# Patient Record
Sex: Female | Born: 1955 | Race: White | Hispanic: No | Marital: Married | Smoking: Never smoker
Health system: Southern US, Community
[De-identification: ages and names within clinical notes are randomized; demographics above are authoritative.]

## PROBLEM LIST (undated history)

## (undated) DIAGNOSIS — M1990 Unspecified osteoarthritis, unspecified site: Secondary | ICD-10-CM

## (undated) DIAGNOSIS — I2510 Atherosclerotic heart disease of native coronary artery without angina pectoris: Secondary | ICD-10-CM

## (undated) DIAGNOSIS — K219 Gastro-esophageal reflux disease without esophagitis: Secondary | ICD-10-CM

## (undated) DIAGNOSIS — I10 Essential (primary) hypertension: Secondary | ICD-10-CM

## (undated) DIAGNOSIS — J45909 Unspecified asthma, uncomplicated: Secondary | ICD-10-CM

## (undated) HISTORY — PX: TONSILLECTOMY: SUR1361

## (undated) HISTORY — DX: Unspecified osteoarthritis, unspecified site: M19.90

## (undated) HISTORY — PX: BACK SURGERY: SHX140

## (undated) HISTORY — DX: Gastro-esophageal reflux disease without esophagitis: K21.9

## (undated) HISTORY — DX: Unspecified asthma, uncomplicated: J45.909

## (undated) HISTORY — DX: Atherosclerotic heart disease of native coronary artery without angina pectoris: I25.10

---

## 2001-06-29 HISTORY — PX: LUMBAR DISC SURGERY: SHX700

## 2004-09-16 ENCOUNTER — Ambulatory Visit: Payer: Self-pay | Admitting: Family Medicine

## 2004-10-06 ENCOUNTER — Ambulatory Visit: Payer: Self-pay | Admitting: Family Medicine

## 2005-10-12 ENCOUNTER — Ambulatory Visit: Payer: Self-pay | Admitting: Family Medicine

## 2006-10-21 ENCOUNTER — Ambulatory Visit: Payer: Self-pay | Admitting: Family Medicine

## 2006-11-15 ENCOUNTER — Ambulatory Visit: Payer: Self-pay | Admitting: Internal Medicine

## 2006-11-28 ENCOUNTER — Ambulatory Visit: Payer: Self-pay | Admitting: Internal Medicine

## 2006-12-28 ENCOUNTER — Ambulatory Visit: Payer: Self-pay | Admitting: Internal Medicine

## 2007-01-28 ENCOUNTER — Ambulatory Visit: Payer: Self-pay | Admitting: Internal Medicine

## 2007-02-28 ENCOUNTER — Ambulatory Visit: Payer: Self-pay | Admitting: Internal Medicine

## 2007-04-30 ENCOUNTER — Ambulatory Visit: Payer: Self-pay | Admitting: Internal Medicine

## 2007-05-09 ENCOUNTER — Ambulatory Visit: Payer: Self-pay | Admitting: Internal Medicine

## 2007-05-30 ENCOUNTER — Ambulatory Visit: Payer: Self-pay | Admitting: Internal Medicine

## 2007-10-25 ENCOUNTER — Ambulatory Visit: Payer: Self-pay | Admitting: Family Medicine

## 2007-10-28 ENCOUNTER — Ambulatory Visit: Payer: Self-pay | Admitting: Internal Medicine

## 2008-06-29 HISTORY — PX: COLONOSCOPY: SHX174

## 2008-07-09 ENCOUNTER — Ambulatory Visit: Payer: Self-pay | Admitting: Gastroenterology

## 2008-07-09 LAB — HM COLONOSCOPY

## 2011-04-26 ENCOUNTER — Emergency Department: Payer: Self-pay | Admitting: Unknown Physician Specialty

## 2013-06-29 HISTORY — PX: CARDIAC CATHETERIZATION: SHX172

## 2013-11-28 ENCOUNTER — Encounter: Payer: Self-pay | Admitting: Internal Medicine

## 2015-03-05 ENCOUNTER — Ambulatory Visit: Payer: BLUE CROSS/BLUE SHIELD | Attending: Otolaryngology

## 2015-03-05 DIAGNOSIS — G4733 Obstructive sleep apnea (adult) (pediatric): Secondary | ICD-10-CM | POA: Insufficient documentation

## 2016-09-24 ENCOUNTER — Emergency Department
Admission: EM | Admit: 2016-09-24 | Discharge: 2016-09-24 | Disposition: A | Discharge status: Home / Self Care | Payer: BLUE CROSS/BLUE SHIELD | Attending: Emergency Medicine | Admitting: Emergency Medicine

## 2016-09-24 ENCOUNTER — Emergency Department: Payer: BLUE CROSS/BLUE SHIELD

## 2016-09-24 ENCOUNTER — Encounter: Payer: Self-pay | Admitting: Emergency Medicine

## 2016-09-24 DIAGNOSIS — K529 Noninfective gastroenteritis and colitis, unspecified: Secondary | ICD-10-CM | POA: Insufficient documentation

## 2016-09-24 DIAGNOSIS — R1032 Left lower quadrant pain: Secondary | ICD-10-CM

## 2016-09-24 DIAGNOSIS — R197 Diarrhea, unspecified: Secondary | ICD-10-CM

## 2016-09-24 DIAGNOSIS — I10 Essential (primary) hypertension: Secondary | ICD-10-CM | POA: Diagnosis not present

## 2016-09-24 DIAGNOSIS — Z79899 Other long term (current) drug therapy: Secondary | ICD-10-CM | POA: Insufficient documentation

## 2016-09-24 HISTORY — DX: Essential (primary) hypertension: I10

## 2016-09-24 LAB — URINALYSIS, COMPLETE (UACMP) WITH MICROSCOPIC
BILIRUBIN URINE: NEGATIVE
Glucose, UA: NEGATIVE mg/dL
Ketones, ur: NEGATIVE mg/dL
LEUKOCYTES UA: NEGATIVE
NITRITE: NEGATIVE
Protein, ur: NEGATIVE mg/dL
SPECIFIC GRAVITY, URINE: 1.003 — AB (ref 1.005–1.030)
pH: 6 (ref 5.0–8.0)

## 2016-09-24 LAB — BASIC METABOLIC PANEL
BUN: 10 (ref 4–21)
Creatinine: 0.9 (ref 0.5–1.1)
Glucose: 133
Potassium: 3.9 (ref 3.4–5.3)
Sodium: 139 (ref 137–147)

## 2016-09-24 LAB — GASTROINTESTINAL PANEL BY PCR, STOOL (REPLACES STOOL CULTURE)

## 2016-09-24 LAB — C DIFFICILE QUICK SCREEN W PCR REFLEX
C DIFFICILE (CDIFF) INTERP: DETECTED
C Diff antigen: POSITIVE — AB
C Diff toxin: POSITIVE — AB

## 2016-09-24 MED ORDER — IOPAMIDOL (ISOVUE-300) INJECTION 61%
100.0000 mL | Freq: Once | INTRAVENOUS | Status: AC | PRN
  Administered 2016-09-24: 100 mL via INTRAVENOUS

## 2016-09-24 MED ORDER — ONDANSETRON 4 MG PO TBDP: 4.0000 mg | ORAL_TABLET | Freq: Four times a day (QID) | ORAL | 0 refills | Status: DC | PRN

## 2016-09-24 MED ORDER — HYDROMORPHONE HCL 1 MG/ML IJ SOLN
0.5000 mg | Freq: Once | INTRAMUSCULAR | Status: AC
  Administered 2016-09-24: 0.5 mg via INTRAVENOUS
  Filled 2016-09-24: qty 1

## 2016-09-24 MED ORDER — IOPAMIDOL (ISOVUE-300) INJECTION 61%
30.0000 mL | Freq: Once | INTRAVENOUS | Status: AC | PRN
  Administered 2016-09-24: 30 mL via ORAL

## 2016-09-24 MED ORDER — SODIUM CHLORIDE 0.9 % IV BOLUS (SEPSIS)
1000.0000 mL | Freq: Once | INTRAVENOUS | Status: AC
  Administered 2016-09-24: 1000 mL via INTRAVENOUS

## 2016-09-24 MED ORDER — METRONIDAZOLE 500 MG PO TABS: 500.0000 mg | ORAL_TABLET | Freq: Three times a day (TID) | ORAL | 0 refills | Status: DC

## 2016-09-24 MED ORDER — ONDANSETRON HCL 4 MG/2ML IJ SOLN
4.0000 mg | Freq: Once | INTRAMUSCULAR | Status: AC
  Administered 2016-09-24: 4 mg via INTRAVENOUS
  Filled 2016-09-24: qty 2

## 2016-09-24 NOTE — ED Notes (Signed)
Patient transported to CT 

## 2016-09-24 NOTE — ED Provider Notes (Addendum)
----------------------------------------- 4:24 PM on 09/24/2016 -----------------------------------------  Ct Abdomen Pelvis W Contrast  Result Date: 09/24/2016 CLINICAL DATA:  61 year old female with history of worsening left lower quadrant abdominal pain for 1 week, particularly after eating. EXAM: CT ABDOMEN AND PELVIS WITH CONTRAST TECHNIQUE: Multidetector CT imaging of the abdomen and pelvis was performed using the standard protocol following bolus administration of intravenous contrast. CONTRAST:  100mL ISOVUE-300 IOPAMIDOL (ISOVUE-300) INJECTION 61% COMPARISON:  No priors. FINDINGS: Lower chest: Scarring in the inferior segment of the lingula. Calcifications of the aortic valve. Hepatobiliary: No cystic or solid hepatic lesions. No intra or extrahepatic biliary ductal dilatation. Gallbladder is normal in appearance. Pancreas: No pancreatic mass. No pancreatic ductal dilatation. No pancreatic or peripancreatic fluid or inflammatory changes. Spleen: Unremarkable. Adrenals/Urinary Tract: Bilateral kidneys and bilateral adrenal glands are normal in appearance. No hydroureteronephrosis. Urinary bladder is normal in appearance. Stomach/Bowel: Normal appearance of the stomach. No pathologic dilatation of small bowel or colon. Relatively diffuse colonic wall thickening which is most severe in the cecum and ascending colon where there is also extensive surrounding inflammatory changes in the adjacent fat, concerning for an acute colitis. The appendix is not confidently identified and may be surgically absent. Vascular/Lymphatic: Aortic atherosclerosis, without evidence of aneurysm or dissection in the abdominal or pelvic vasculature. No lymphadenopathy noted in the abdomen or pelvis. Reproductive: Uterus and ovaries are unremarkable in appearance. Other: No significant volume of ascites.  No pneumoperitoneum. Musculoskeletal: There are no aggressive appearing lytic or blastic lesions noted in the visualized  portions of the skeleton. IMPRESSION: 1. Findings are most compatible with a colitis, which appears most severe in the cecum and ascending colon. 2. Colonic diverticulosis. 3. Aortic atherosclerosis. 4. There are calcifications of the aortic valve. Echocardiographic correlation for evaluation of potential valvular dysfunction may be warranted if clinically indicated. Electronically Signed   By: Trudie Reedaniel  Entrikin M.D.   On: 09/24/2016 15:58     Patient is awake, alert and very pleasant. She reports her pain and nausea has resolved. She has not been vomiting. She feels and wishes to eat and drink, and is asking if she can go home soon. She does report feeling much improved. I carefully discussed her CT findings, and she has not yet been able to provide a stool sample. There is concern about possible C. difficile given recent antibiotic use, though also her CT does show evidence of colitis and noted diverticulosis raising suspicion for diverticulitis as well. She is now having formed stool, and has not had any more large volume diarrhea in the ER. On reexam her abdomen is soft nontender nondistended. Her white blood cell count is notably elevated outpatient, but she does not meet any other criteria that suggest sepsis. She appears appropriate for outpatient management, and I discussed her case findings and recent antibiotic use with Dr. Tobi BastosAnna who recommends discharge on flagyl only and await stool culture results. Advises no other anitbiotics as they could worsen C.Diff (if this is cause.) Advises close follow-up, and patient reports this would not be a problem that she follows with a clinic where she can get close follow-up in [place].  Return precautions and treatment recommendations and follow-up discussed with the patient who is agreeable with the plan.    Sharyn CreamerMark Ashely Joshua, MD 09/24/16 1629   ----------------------------------------- 6:08 PM on 09/24/2016 -----------------------------------------  C.  difficile positive. Called and spoke directly to patient about this, she will be setting up close follow-up, continuing Flagyl, and I again reaffirmed careful return precautions for which  she is very agreeable   Sharyn Creamer, MD 09/24/16 [id number]

## 2016-09-24 NOTE — ED Notes (Signed)
Date and time results received: 09/24/16 1803 (use smartphrase ".now" to insert current time)  Test:C-diff Critical Value: positive  Name of Provider Notified: Dr Wynelle BeckmannPadowski Orders Received? Or Actions Taken?: none, pt sent home on Flagyl

## 2016-09-24 NOTE — ED Provider Notes (Signed)
[facility] Emergency Department Provider Note  ____________________________________________  Time seen: Approximately 2:01 PM  I have reviewed the triage vital signs and the nursing notes.   HISTORY  Chief Complaint Abdominal Pain    HPI Stephanie Mullen is a 61 y.o. female with a history of recent antibiotic use presenting with left lower quadrant pain and diarrhea.The patient reports that several weeks ago, she was placed on Augmentin for her cough and cold symptoms. She immediately developed diarrhea and self discontinued that medication. Since then, she has had watery nonbloody diarrhea, up to 10 daily episodes area did on Sunday, she began to develop progressively worsening left lower quadrant pain that required her to skip work several days this week. She is not had any fever, chills pages had nausea without vomiting. No urinary symptoms. The patient presented to her primary care's edition's office today and was found to have a white blood cell count of 20.3 and examination concerning for left lower quadrant pain so was sent here for further evaluation and treatment.   Past Medical History:  Diagnosis Date  . Hypertension     There are no active problems to display for this patient.   Past Surgical History:  Procedure Laterality Date  . BACK SURGERY    . TONSILLECTOMY        Allergies Celebrex [celecoxib]  No family history on file.  Social History Social History  Substance Use Topics  . Smoking status: Never Smoker  . Smokeless tobacco: Never Used  . Alcohol use No    Review of Systems Constitutional: No fever/chills.Positive general malaise and fatigue. No lightheadedness or syncope. Eyes: No visual changes. ENT: No sore throat. No congestion or rhinorrhea. Cardiovascular: Denies chest pain. Denies palpitations. Respiratory: Denies shortness of breath.  No cough. Gastrointestinal: Positive left lower quadrant abdominal pain.   Positive nausea, no vomiting.  Positive diarrhea.  No constipation. Genitourinary: Negative for dysuria. Musculoskeletal: Negative for back pain. Skin: Negative for rash. Neurological: Negative for headaches. No focal numbness, tingling or weakness.   10-point ROS otherwise negative.  ____________________________________________   PHYSICAL EXAM:  VITAL SIGNS: ED Triage Vitals  Enc Vitals Group     BP 09/24/16 1150 128/74     Pulse Rate 09/24/16 1150 94     Resp 09/24/16 1150 20     Temp 09/24/16 1150 99.2 F (37.3 C)     Temp Source 09/24/16 1150 Oral     SpO2 09/24/16 1150 98 %     Weight 09/24/16 1150 218 lb (98.9 kg)     Height 09/24/16 1150 5\' 1"  (1.549 m)     Head Circumference --      Peak Flow --      Pain Score 09/24/16 1149 2     Pain Loc --      Pain Edu? --      Excl. in GC? --     Constitutional: Alert and oriented. Well appearing and in no acute distress. Answers questions appropriately. Eyes: Conjunctivae are normal.  EOMI. No scleral icterus. Head: Atraumatic. Nose: No congestion/rhinnorhea. Mouth/Throat: Mucous membranes are moist.  Neck: No stridor.  Supple.   Cardiovascular: Normal rate, regular rhythm. No murmurs, rubs or gallops.  Respiratory: Normal respiratory effort.  No accessory muscle use or retractions. Lungs CTAB.  No wheezes, rales or ronchi. Gastrointestinal: Obese. Soft. Nondistended. Tender to palpation in the left lower quadrant.  No guarding or rebound.  No peritoneal signs. Musculoskeletal: No LE edema.  Neurologic:  A&Ox3.  Speech is clear.  Face and smile are symmetric.  EOMI.  Moves all extremities well. Skin:  Skin is warm, dry and intact. No rash noted. Psychiatric: Mood and affect are normal. Speech and behavior are normal.  Normal judgement.  ____________________________________________   LABS (all labs ordered are listed, but only abnormal results are displayed)  Labs Reviewed  C DIFFICILE QUICK SCREEN W PCR REFLEX   GASTROINTESTINAL PANEL BY PCR, STOOL (REPLACES STOOL CULTURE)   ____________________________________________  EKG  Not indicated ____________________________________________  RADIOLOGY  No results found.  ____________________________________________   PROCEDURES  Procedure(s) performed: None  Procedures  Critical Care performed: No ____________________________________________   INITIAL IMPRESSION / ASSESSMENT AND PLAN / ED COURSE  Pertinent labs & imaging results that were available during my care of the patient were reviewed by me and considered in my medical decision making (see chart for details).  61 y.o. female with recent Augmentin course discontinued for ongoing diarrhea presenting with aggressively worsening left lower quadrant pain. Overall, the patient's examination is concerning for possible diverticulitis or diverticulitis with complication such as abscess. I'm also concerned about C. difficile given her copious diarrhea over the last several weeks after antibiotic use. Stool studies will also be sent. We'll check her for any joint disturbance given the amount of diarrhea she has been having. Plan reevaluation for final disposition.  ----------------------------------------- 2:53 PM on 09/24/2016 -----------------------------------------  The patient's pain has completely resolved and she was able to tolerate the contrast for the CT.  We are awaiting a stool sample as well.  The patient will be signed out to Dr. Sharyn Creamer, who will follow up the CT results and re-evaluate the patient for final disposition.  ____________________________________________  FINAL CLINICAL IMPRESSION(S) / ED DIAGNOSES  Final diagnoses:  Left lower quadrant pain  Diarrhea, unspecified type         NEW MEDICATIONS STARTED DURING THIS VISIT:  New Prescriptions   No medications on file      Rockne Menghini, MD 09/24/16 1456

## 2016-09-24 NOTE — Discharge Instructions (Signed)
We believe your symptoms are caused by colitis.  Most of the time this condition (please read through the included information) can be cured with outpatient antibiotics.  Please take the full course of prescribed medication(s) and follow up with the doctors recommended above.  Return to the ED if your abdominal pain worsens or fails to improve, you develop bloody vomiting, bloody diarrhea, you are unable to tolerate fluids due to vomiting, fever greater than 101, or other symptoms that concern you.

## 2016-09-24 NOTE — ED Triage Notes (Signed)
Pt sent over for further eval of possible diverticulitis. Had labs drawn today with a WBC of 20. LLQ pain for one week, worse after she eats, cramping comes and goes.

## 2016-09-24 NOTE — ED Notes (Signed)
PT HAD LABS DRAWN OVER AT [facility] PRIOR TO ARRIVAL AND HAS NOT CROSSED OVER INTO OUR SYSTEM. CBC/CMP RESULTS ATTACHED.

## 2017-02-24 DIAGNOSIS — M25562 Pain in left knee: Secondary | ICD-10-CM | POA: Insufficient documentation

## 2017-02-24 DIAGNOSIS — R600 Localized edema: Secondary | ICD-10-CM | POA: Insufficient documentation

## 2017-02-24 DIAGNOSIS — R768 Other specified abnormal immunological findings in serum: Secondary | ICD-10-CM | POA: Insufficient documentation

## 2017-02-24 DIAGNOSIS — R7689 Other specified abnormal immunological findings in serum: Secondary | ICD-10-CM | POA: Insufficient documentation

## 2017-05-05 IMAGING — CT CT ABD-PELV W/ CM
2 of 5 series · 16 of 46 positions shown, 18 images · IV contrast (APPLIED)
Comparison: No priors.

CLINICAL DATA: 50-year-old female with history of worsening left
lower quadrant abdominal pain for 1 week, particularly after eating.

EXAM:
CT ABDOMEN AND PELVIS WITH CONTRAST
TECHNIQUE: Multidetector CT imaging of the abdomen and pelvis was performed
using the standard protocol following bolus administration of
intravenous contrast.
CONTRAST:  100mL [id number] IOPAMIDOL ([id number]) INJECTION 61%

[Series 2: routine abd/pel with · axial · 0.85mm/px · z∈[-424,-34]mm · 13 of 88 slices shown, 15 images]
[im 5/88  soft-tissue]
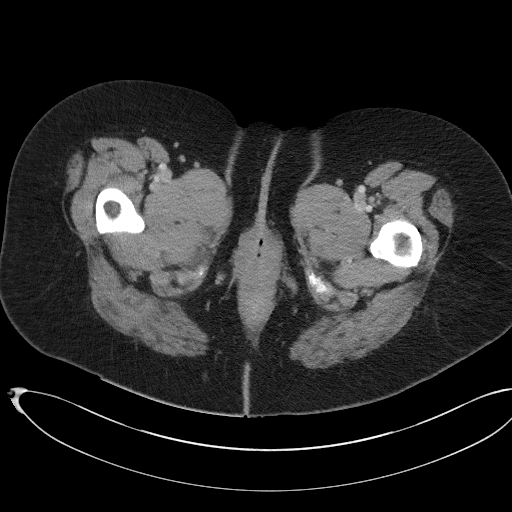
[im 5/88  bone]
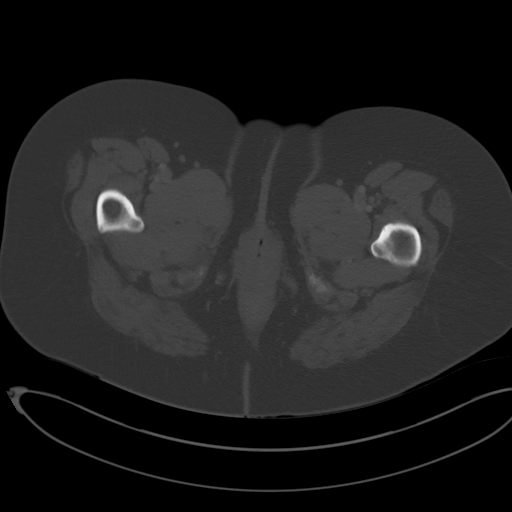
[im 10/88  soft-tissue]
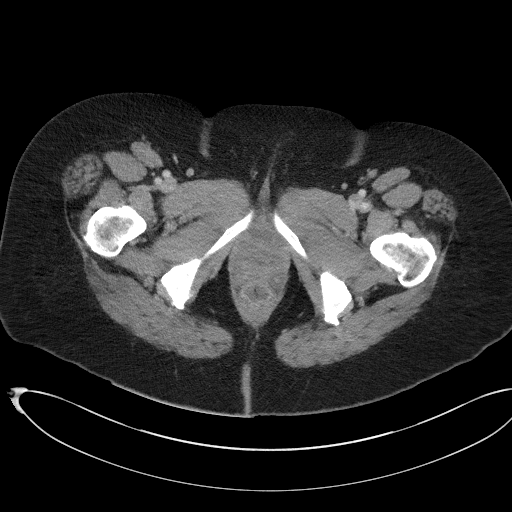
[im 20/88  soft-tissue]
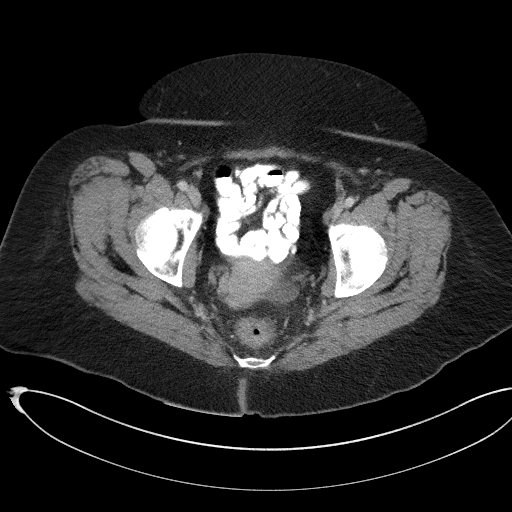
[im 25/88  soft-tissue]
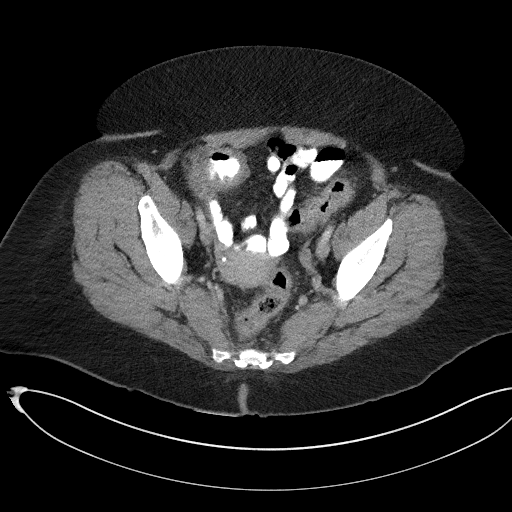
[im 30/88  soft-tissue]
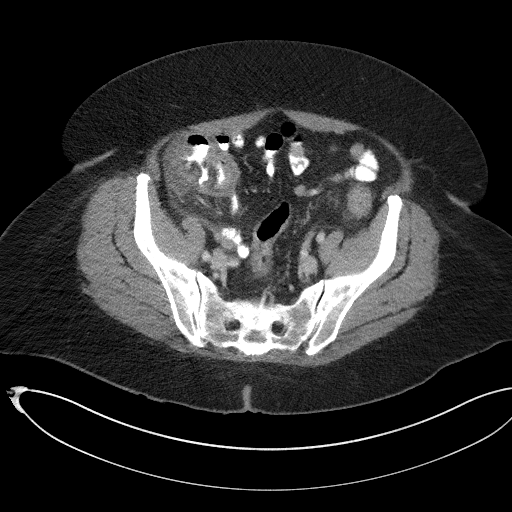
[im 39/88  soft-tissue]
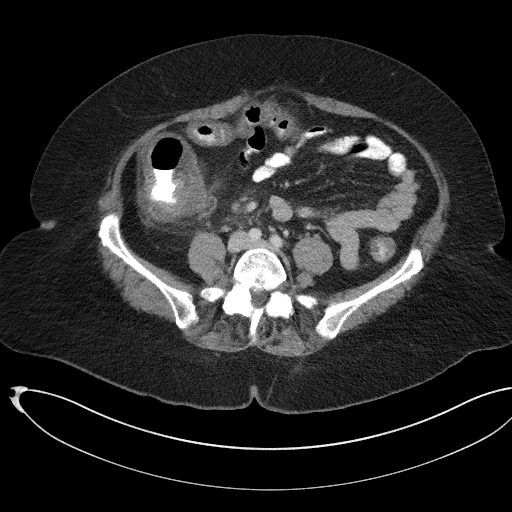
[im 44/88  soft-tissue]
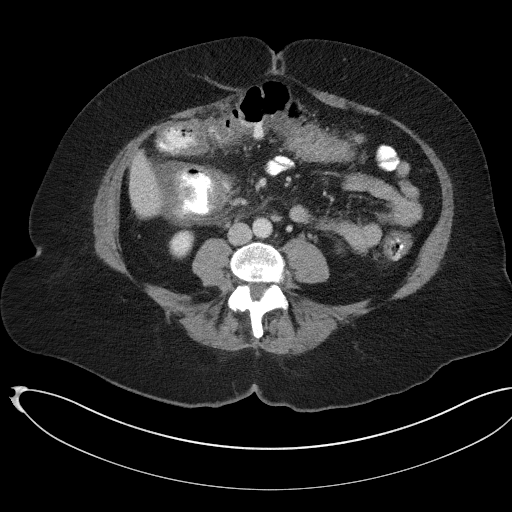
[im 49/88  soft-tissue]
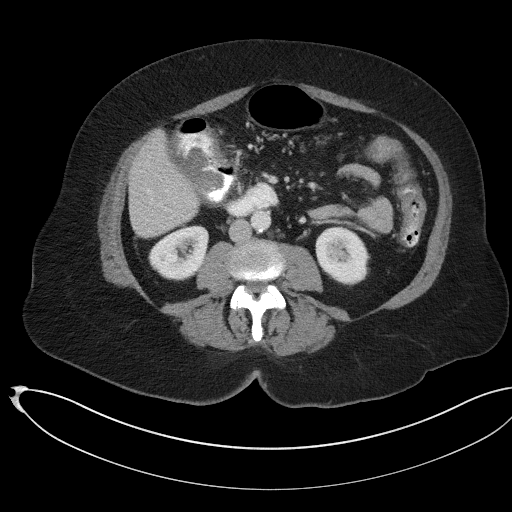
[im 59/88  soft-tissue]
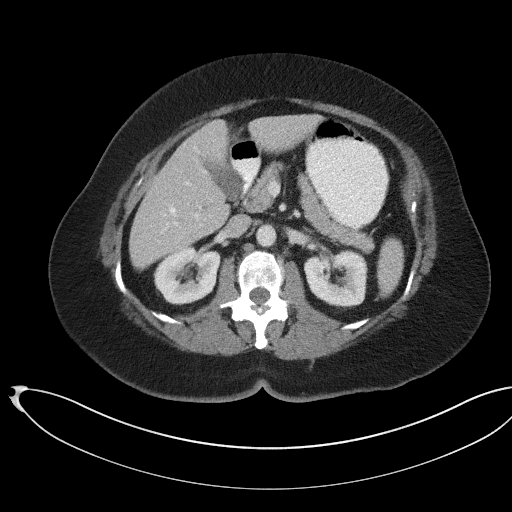
[im 59/88  bone]
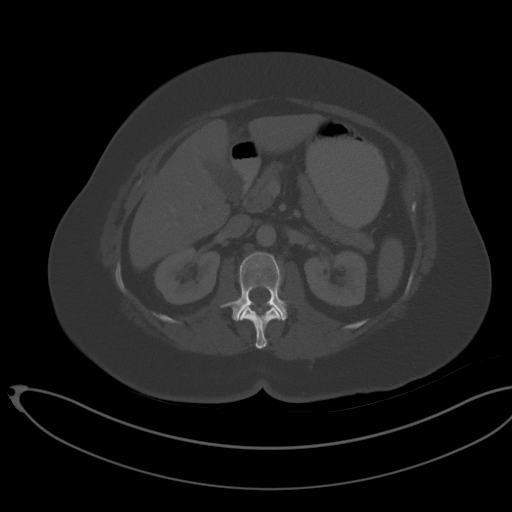
[im 63/88  soft-tissue]
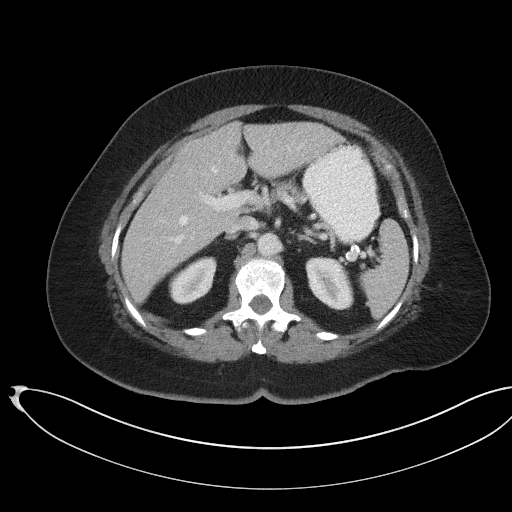
[im 68/88  soft-tissue]
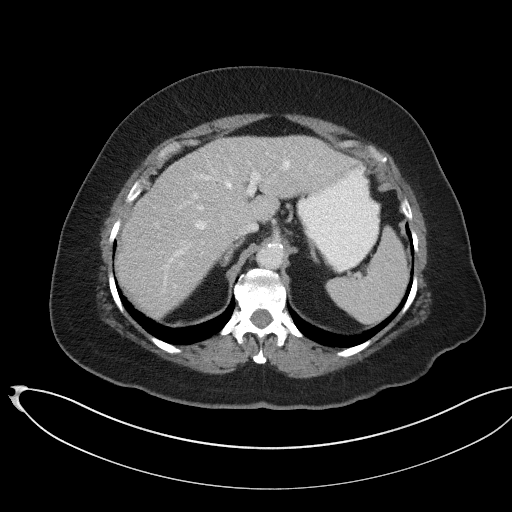
[im 78/88  soft-tissue]
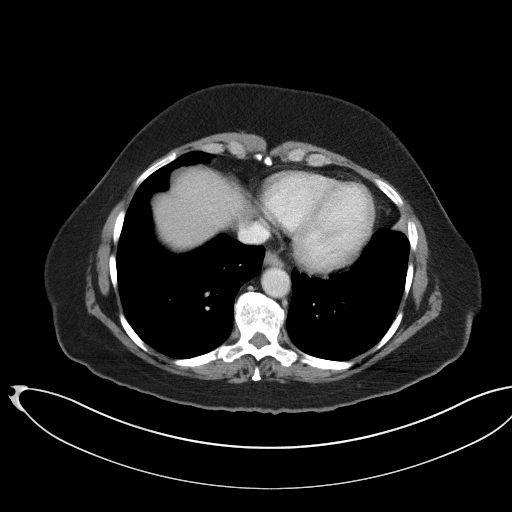
[im 83/88  soft-tissue]
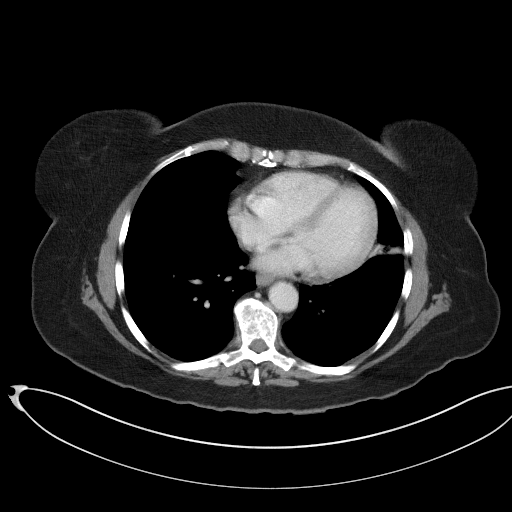

[Series 5: coronal st · coronal · 0.84mm/px · 3 of 104 slices shown]
[im 35/104  soft-tissue]
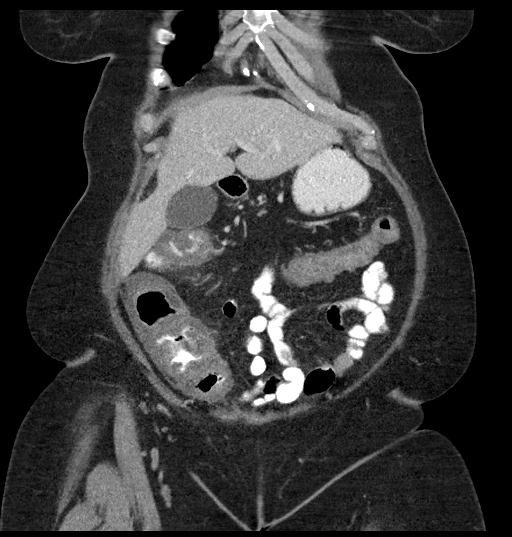
[im 46/104  soft-tissue]
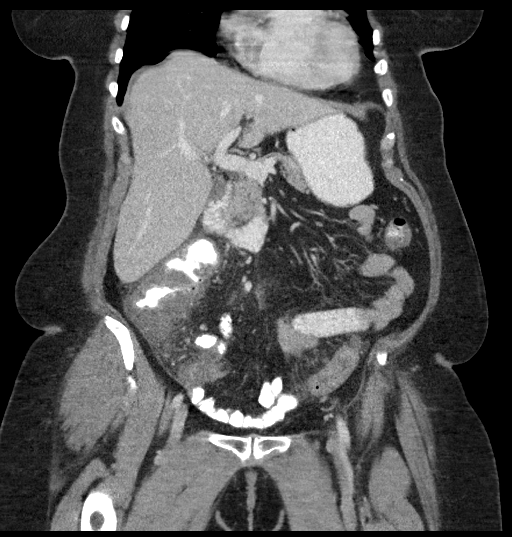
[im 58/104  soft-tissue]
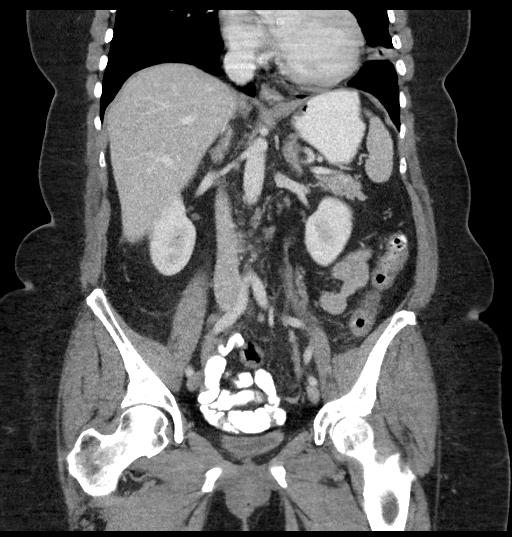

[16 of 46 positions shown; findings below may reference images not displayed]

FINDINGS: Lower chest: Scarring in the inferior segment of the lingula.
Calcifications of the aortic valve.

Hepatobiliary: No cystic or solid hepatic lesions. No intra or
extrahepatic biliary ductal dilatation. Gallbladder is normal in
appearance.

Pancreas: No pancreatic mass. No pancreatic ductal dilatation. No
pancreatic or peripancreatic fluid or inflammatory changes.

Spleen: Unremarkable.

Adrenals/Urinary Tract: Bilateral kidneys and bilateral adrenal
glands are normal in appearance. No hydroureteronephrosis. Urinary
bladder is normal in appearance.

Stomach/Bowel: Normal appearance of the stomach. No pathologic
dilatation of small bowel or colon. Relatively diffuse colonic wall
thickening which is most severe in the cecum and ascending colon
where there is also extensive surrounding inflammatory changes in
the adjacent fat, concerning for an acute colitis. The appendix is
not confidently identified and may be surgically absent.

Vascular/Lymphatic: Aortic atherosclerosis, without evidence of
aneurysm or dissection in the abdominal or pelvic vasculature. No
lymphadenopathy noted in the abdomen or pelvis.

Reproductive: Uterus and ovaries are unremarkable in appearance.

Other: No significant volume of ascites.  No pneumoperitoneum.

Musculoskeletal: There are no aggressive appearing lytic or blastic
lesions noted in the visualized portions of the skeleton.
IMPRESSION: 1. Findings are most compatible with a colitis, which appears most
severe in the cecum and ascending colon.
2. Colonic diverticulosis.
3. Aortic atherosclerosis.
4. There are calcifications of the aortic valve. Echocardiographic
correlation for evaluation of potential valvular dysfunction may be
warranted if clinically indicated.

## 2017-11-05 LAB — HM MAMMOGRAPHY

## 2018-05-03 LAB — LIPID PANEL
Cholesterol: 187 (ref 0–200)
HDL: 50 (ref 35–70)
LDL Cholesterol: 115
Triglycerides: 110 (ref 40–160)

## 2018-05-03 LAB — HEMOGLOBIN A1C: Hemoglobin A1C: 6.2

## 2018-05-04 LAB — BASIC METABOLIC PANEL
BUN: 14 (ref 4–21)
Creatinine: 0.8 (ref 0.5–1.1)
Glucose: 108
Potassium: 4.9 (ref 3.4–5.3)
Sodium: 145 (ref 137–147)

## 2018-06-08 LAB — CBC AND DIFFERENTIAL
HCT: 38 (ref 36–46)
Hemoglobin: 12.8 (ref 12.0–16.0)
Platelets: 239 (ref 150–399)
WBC: 3.1

## 2018-07-06 LAB — CBC AND DIFFERENTIAL
HCT: 36 (ref 36–46)
Hemoglobin: 12 (ref 12.0–16.0)
Platelets: 257 (ref 150–399)
WBC: 11.4

## 2018-07-21 ENCOUNTER — Encounter: Payer: Self-pay | Admitting: *Deleted

## 2018-07-28 ENCOUNTER — Ambulatory Visit (INDEPENDENT_AMBULATORY_CARE_PROVIDER_SITE_OTHER): Payer: BLUE CROSS/BLUE SHIELD | Admitting: Allergy and Immunology

## 2018-07-28 ENCOUNTER — Encounter: Payer: Self-pay | Admitting: Allergy and Immunology

## 2018-07-28 VITALS — BP 150/92 | HR 87 | Temp 98.9°F | Resp 18 | Ht 61.0 in | Wt 228.2 lb

## 2018-07-28 DIAGNOSIS — K219 Gastro-esophageal reflux disease without esophagitis: Secondary | ICD-10-CM | POA: Diagnosis not present

## 2018-07-28 DIAGNOSIS — J3089 Other allergic rhinitis: Secondary | ICD-10-CM

## 2018-07-28 DIAGNOSIS — J4540 Moderate persistent asthma, uncomplicated: Secondary | ICD-10-CM

## 2018-07-28 MED ORDER — BUDESONIDE-FORMOTEROL FUMARATE 160-4.5 MCG/ACT IN AERO: INHALATION_SPRAY | RESPIRATORY_TRACT | 5 refills | Status: DC

## 2018-07-28 MED ORDER — FAMOTIDINE 40 MG PO TABS: ORAL_TABLET | ORAL | 5 refills | Status: DC

## 2018-07-28 MED ORDER — ESOMEPRAZOLE MAGNESIUM 40 MG PO CPDR: DELAYED_RELEASE_CAPSULE | ORAL | 5 refills | Status: DC

## 2018-07-28 MED ORDER — TRIAMCINOLONE ACETONIDE 55 MCG/ACT NA AERO: INHALATION_SPRAY | NASAL | 5 refills | Status: DC

## 2018-07-28 NOTE — Progress Notes (Addendum)
NEW PATIENT NOTE  Referring Provider: No ref. provider found Primary Provider: Hal MoralesGunter, Tara G, NP Date of office visit: 07/28/2018    Subjective:   Chief Complaint:  Stephanie Mullen (DOB: 1956-05-26) is a 63 y.o. female who presents to the clinic on 07/28/2018 with a chief complaint of Cough .  HPI: Stephanie Mullen presents this clinic in evaluation of cough.  Apparently since December 2019 she has been having a cough.  She has a chronic cough associated with throat clearing and a tickle in her throat and posttussive emesis at least a few times per week.  She has not noticed any choking or swallowing difficulty with this issue.  Occasionally she coughs so much she does get short of breath.  She does not really exercise to any degree so she is not really sure she gets short of breath when exerting herself.  Apparently in December 2019 she became "sick" with lots of coughing and fever and yellow sputum production and head congestion for which she was treated with 2 antibiotics and a systemic steroid yet her cough still continues.  There is not really an obvious provoking factor giving rise to this issue.  She has not noticed any significant environmental change that accompanies this issue over the course of the past 6 months.  She does have a history of having allergies with sneezing and occasional postnasal drip all of her life.  She does not have any anosmia or ugly nasal discharge or headache on a regular basis.  If she does not use any antihistamine sometimes she will get a headache but no headaches while using an antihistamine.  There is not really an obvious provoking factor giving rise to this issue.  She will taken Nexium occasionally for burning up in her chest averaging out about 1 time every 2 weeks.  However as noted above she does have posttussive emesis and is vomiting at least twice a week.  She has 1 coffee or 1 Coke per day and she has chocolate twice a week.  She does snore  pretty loud and apparently had a sleep study which did not identify any significant amount of sleep apnea.  Apparently she has had a chest x-ray which did not identify any significant abnormality in investigation of her cough.  Past Medical History:  Diagnosis Date  . Hypertension     Past Surgical History:  Procedure Laterality Date  . BACK SURGERY    . TONSILLECTOMY      Allergies as of 07/28/2018      Reactions   Celebrex [celecoxib] Rash      Medication List      acetaminophen 325 MG tablet Commonly known as:  TYLENOL Take 650 mg by mouth every 6 (six) hours as needed.   albuterol 108 (90 Base) MCG/ACT inhaler Commonly known as:  PROVENTIL HFA;VENTOLIN HFA Inhale 2 puffs into the lungs every 6 (six) hours as needed for wheezing or shortness of breath.   amLODipine 5 MG tablet Commonly known as:  NORVASC Take 5 mg by mouth daily.   ESCITALOPRAM OXALATE PO Take by mouth.   levocetirizine 5 MG tablet Commonly known as:  XYZAL Take 5 mg by mouth every evening.       Review of systems negative except as noted in HPI / PMHx or noted below:  Review of Systems  Constitutional: Negative.   HENT: Negative.   Eyes: Negative.   Respiratory: Negative.   Cardiovascular: Negative.   Gastrointestinal: Negative.  Genitourinary: Negative.   Musculoskeletal: Negative.   Skin: Negative.   Neurological: Negative.   Endo/Heme/Allergies: Negative.   Psychiatric/Behavioral: Negative.     Family History  Problem Relation Age of Onset  . Asthma Mother   . Allergies Father   . Cystic fibrosis Sister     Social History   Socioeconomic History  . Marital status: Married    Spouse name: Not on file  . Number of children: Not on file  . Years of education: Not on file  . Highest education level: Not on file  Occupational History  . Not on file  Social Needs  . Financial resource strain: Not on file  . Food insecurity:    Worry: Not on file    Inability: Not on  file  . Transportation needs:    Medical: Not on file    Non-medical: Not on file  Tobacco Use  . Smoking status: Never Smoker  . Smokeless tobacco: Never Used  Substance and Sexual Activity  . Alcohol use: No  . Drug use: Not on file  . Sexual activity: Not on file  Lifestyle  . Physical activity:    Days per week: Not on file    Minutes per session: Not on file  . Stress: Not on file  Relationships  . Social connections:    Talks on phone: Not on file    Gets together: Not on file    Attends religious service: Not on file    Active member of club or organization: Not on file    Attends meetings of clubs or organizations: Not on file    Relationship status: Not on file  . Intimate partner violence:    Fear of current or ex partner: Not on file    Emotionally abused: Not on file    Physically abused: Not on file    Forced sexual activity: Not on file  Other Topics Concern  . Not on file  Social History Narrative  . Not on file    Environmental and Social history  Lives in a mobile home with a dry environment, no animals located inside the household, carpet in the bedroom, no plastic on the bed, no plastic on the pillow, no smokers located inside the household.  She works in an office setting.  Objective:   Vitals:   07/28/18 0924  BP: (!) 150/92  Pulse: 87  Resp: 18  Temp: 98.9 F (37.2 C)   Height: 5\' 1"  (154.9 cm) Weight: 228 lb 3.2 oz (103.5 kg)  Physical Exam Constitutional:      Appearance: She is not diaphoretic.     Comments: Constant throat clearing  HENT:     Head: Normocephalic. No right periorbital erythema or left periorbital erythema.     Right Ear: Tympanic membrane, ear canal and external ear normal.     Left Ear: Tympanic membrane, ear canal and external ear normal.     Nose: Nose normal. No mucosal edema or rhinorrhea.     Mouth/Throat:     Pharynx: No oropharyngeal exudate.  Eyes:     General: Lids are normal.     Conjunctiva/sclera:  Conjunctivae normal.     Pupils: Pupils are equal, round, and reactive to light.  Neck:     Thyroid: No thyromegaly.     Trachea: Trachea normal. No tracheal deviation.  Cardiovascular:     Rate and Rhythm: Normal rate and regular rhythm.     Heart sounds: Normal heart sounds, S1 normal  and S2 normal. No murmur.  Pulmonary:     Effort: Pulmonary effort is normal. No respiratory distress.     Breath sounds: No stridor. No wheezing or rales.  Chest:     Chest wall: No tenderness.  Abdominal:     General: There is no distension.     Palpations: Abdomen is soft. There is no mass.     Tenderness: There is no abdominal tenderness. There is no guarding or rebound.  Musculoskeletal:        General: No tenderness.  Lymphadenopathy:     Head:     Right side of head: No tonsillar adenopathy.     Left side of head: No tonsillar adenopathy.     Cervical: No cervical adenopathy.  Skin:    Coloration: Skin is not pale.     Findings: No erythema or rash.     Nails: There is no clubbing.   Neurological:     Mental Status: She is alert.     Diagnostics: Allergy skin tests were performed.  She did not demonstrate any hypersensitivity against a screening panel of aeroallergens.  Spirometry was performed and demonstrated an FEV1 of 1.58 @ 73 % of predicted. FEV1/FVC = 0.84.  Following administration of nebulized albuterol her FEV1 did not change significantly  Assessment and Plan:    1. Not well controlled moderate persistent asthma   2. Perennial allergic rhinitis   3. LPRD (laryngopharyngeal reflux disease)      1.  Allergen avoidance measures  2.  Treat and prevent inflammation:   A.  Symbicort 160 -2 inhalations twice a day with spacer  B.  OTC Nasacort -1 spray each nostril once a day  3.  Treat and prevent reflux:   A.  Consolidate caffeine and chocolate consumption  B.  Nexium 40 mg tablet in a.m.  C.  Famotidine 40 mg tablet in p.m.  4.  If needed:   A.  OTC  antihistamine  B.  Proventil HFA or similar 2 inhalations every 4-6 hours  5.  Replace throat clearing maneuver with swallowing maneuver  6.  Review results from recent chest x-ray  7.  Return to clinic in 3 weeks or earlier if problem  It appears as though Libbi has an inflamed and irritated respiratory track and we will treat her with anti-inflammatory agents for both her upper and lower airway and also have her treat reflux aggressively for the next 3 weeks and make a decision about further evaluation and treatment based upon her response to this plan.  Laurette Schimke, MD Allergy / Immunology [facility]

## 2018-07-28 NOTE — Patient Instructions (Addendum)
  1.  Allergen avoidance measures  2.  Treat and prevent inflammation:   A.  Symbicort 160 -2 inhalations twice a day with spacer  B.  OTC Nasacort -1 spray each nostril once a day  3.  Treat and prevent reflux:   A.  Consolidate caffeine and chocolate consumption  B.  Nexium 40 mg tablet in a.m.  C.  Famotidine 40 mg tablet in p.m.  4.  If needed:   A.  OTC antihistamine  B.  Proventil HFA or similar 2 inhalations every 4-6 hours  5.  Replace throat clearing maneuver with swallowing maneuver  6.  Review results from recent chest x-ray  7.  Return to clinic in 3 weeks or earlier if problem

## 2018-08-02 ENCOUNTER — Encounter: Payer: Self-pay | Admitting: Allergy and Immunology

## 2018-08-03 NOTE — Progress Notes (Signed)
New Outpatient Visit Date: 08/08/2018  Referring Provider: Charissa BashJones, Heather H, NP [address] [place], [place] [id number]  Chief Complaint: Fatigue and chest pain  HPI:  [name] is a 63 y.o. female who is being seen today for the evaluation of shortness of breath and chest pressure at the request of Charissa BashJones, Heather H, NP. She has a history of hypertension.  Beginning last summer, [name] began noticing increasing exertional dyspnea as well as occasional tinging in her arms.  She has also had episodic left-sided and substernal chest pain, which is brief and non-exertional.  Due to persistent cough since December, [name] was recently seen by an allergist.  She was diagnosed with asthma and GERD, for which she was placed on Symbicort and Nexium ~2 weeks ago  with significant improvement in her symptoms.  Shortness of breath and cough are both much better.  She has not had any significant chest pain in the last 2 weeks, though she continues to have occasional tinging in her fingers.  She denies orthopnea and edema, though she noted occasional PND prior to beginning Symbicort.  She reports occasional palpitations that come and go over the course of ~15 minutes.  [name] reports similar symptoms ~5 years ago, for which she underwent cardiac catheterization at [facility].  This showed mild, non-obstructive CAD.  She reports having undergone a stress test ~10-15 years ago, which was indeterminate.    --------------------------------------------------------------------------------------------------  Cardiovascular History & Procedures: Cardiovascular Problems:  Non-obstructive CAD and atypical chest pain  Shortness of breath  Palpitations  Risk Factors:  Non-obstructive CAD, hypertension, and obesity.  Cath/PCI:  LHC (12/01/2013, [facility]): LMCA normal.  LAD with 20% mid vessel stenosis.  Ramus normal.  LCx normal.  RCA normal with 30%  stenosis involving rPDA.  CV Surgery:  None  EP Procedures and Devices:  None  Non-Invasive Evaluation(s):  None available  --------------------------------------------------------------------------------------------------  Past Medical History:  Diagnosis Date  . Coronary artery disease   . GERD (gastroesophageal reflux disease)   . Hypertension     Past Surgical History:  Procedure Laterality Date  . BACK SURGERY    . CARDIAC CATHETERIZATION  2015  . TONSILLECTOMY      Current Meds  Medication Sig  . acetaminophen (TYLENOL) 325 MG tablet Take 650 mg by mouth every 6 (six) hours as needed.  Marland Kitchen. albuterol (PROVENTIL HFA;VENTOLIN HFA) 108 (90 Base) MCG/ACT inhaler Inhale 2 puffs into the lungs every 6 (six) hours as needed for wheezing or shortness of breath.  Marland Kitchen. amLODipine (NORVASC) 5 MG tablet Take 5 mg by mouth daily.  . budesonide-formoterol (SYMBICORT) 160-4.5 MCG/ACT inhaler Inhale 2 puffs into the lungs twice daily with spacer  . escitalopram (LEXAPRO) 10 MG tablet Take 10 mg by mouth daily.   Marland Kitchen. ESCITALOPRAM OXALATE PO Take by mouth.  . esomeprazole (NEXIUM) 40 MG capsule Take 1 capsule by mouth daily in the morning  . famotidine (PEPCID) 40 MG tablet Take 1 tablet by mouth daily in the afternoon  . triamcinolone (NASACORT ALLERGY 24HR) 55 MCG/ACT AERO nasal inhaler Use 1 spray in each nostril once daily    Allergies: Celebrex [celecoxib]  Social History   Tobacco Use  . Smoking status: Never Smoker  . Smokeless tobacco: Never Used  Substance Use Topics  . Alcohol use: No  . Drug use: Not on file    Family History  Problem Relation Age of Onset  . Asthma Mother   .  Allergies Father   . Cystic fibrosis Sister     Review of Systems: Patient reports vaginal bleeding/spotting since being treated with a recent course of corticosteroids.  She has been post-menopausal for >10 years.  Otherwise, a 12-system review of systems was performed and was negative  except as noted in the HPI.  --------------------------------------------------------------------------------------------------  Physical Exam: BP 126/82 (BP Location: Right Arm, Patient Position: Sitting, Cuff Size: Large)   Pulse 99   Ht 5\' 1"  (1.549 m)   Wt 226 lb 4 oz (102.6 kg)   SpO2 98%   BMI 42.75 kg/m   General:  NAD HEENT: No conjunctival pallor or scleral icterus. Moist mucous membranes. OP clear. Neck: Supple without lymphadenopathy, thyromegaly, JVD, or HJR. No carotid bruit. Lungs: Normal work of breathing. Clear to auscultation bilaterally without wheezes or crackles. Heart: Regular rate and rhythm without murmurs, rubs, or gallops. Unable to assess PMI due to body habitus. Abd: Bowel sounds present. Soft, NT/ND.  Unable to assess HSM due to body habitus. Ext: No lower extremity edema. Radial, PT, and DP pulses are 2+ bilaterally. Skin: Warm and dry without rash. Neuro: CNIII-XII intact. Strength and fine-touch sensation intact in upper and lower extremities bilaterally. Psych: Normal mood and affect.  EKG:  NSR with borderline LAE.  Otherwise, no significant abnormalities.  Outside labs (07/05/2018): CBC: WBC 11.4, HBG 12.0, HCT 36.3, PLT 257  --------------------------------------------------------------------------------------------------  ASSESSMENT AND PLAN: Dyspnea on exertion Likely multifactorial, though I suspect underlying obstructive lung disease is the primary factor, given significant improvement in symptoms with addition of Symbicort.  We have agreed to obtain an echocardiogram to exclude concurrent structural abnormalities, given history of hypertension and EKG with evidence of left atrial enlargement.  Atypical chest pain and history of mild CAD Pain is not exertional and most likely related to GERD and/or MSK pain.  I have recommended continued use of Nexium and famotidine.  Unless symptoms persist or cardiomyopathy/wall-motion abnormality noted on  echo, we will defer further ischemia testing for now, as cath in 2015 showed only mild mid LAD and rPDA disease.  We will request outside labs to assess LDL.  If not checked recently, lipid panel should be obtained and LDL treated to keep below at least 100, given mild CAD on prior cath.  Defer starting aspirin at his time in light of recent vaginal bleeding.  Hypertension BP reasonable today.  Continue current medications.  Vaginal bleeding Seems to have started since receiving corticosteroids recently.  I have advised the patient to speak with her PCP or gynecologist for further evaluation of vaginal bleeding in a post-menopausal woman.  Morbid obesity BMI > 40.  Weight loss encouraged through diet and exercise.  Follow-up: Return to clinic in 4 months.  Yvonne Kendall, MD 08/08/2018 11:02 AM

## 2018-08-08 ENCOUNTER — Encounter: Payer: Self-pay | Admitting: Internal Medicine

## 2018-08-08 ENCOUNTER — Ambulatory Visit (INDEPENDENT_AMBULATORY_CARE_PROVIDER_SITE_OTHER): Payer: BLUE CROSS/BLUE SHIELD | Admitting: Internal Medicine

## 2018-08-08 ENCOUNTER — Encounter

## 2018-08-08 VITALS — BP 126/82 | HR 99 | Ht 61.0 in | Wt 226.2 lb

## 2018-08-08 DIAGNOSIS — I2510 Atherosclerotic heart disease of native coronary artery without angina pectoris: Secondary | ICD-10-CM | POA: Insufficient documentation

## 2018-08-08 DIAGNOSIS — R0609 Other forms of dyspnea: Secondary | ICD-10-CM | POA: Insufficient documentation

## 2018-08-08 DIAGNOSIS — I10 Essential (primary) hypertension: Secondary | ICD-10-CM | POA: Diagnosis not present

## 2018-08-08 DIAGNOSIS — R0789 Other chest pain: Secondary | ICD-10-CM | POA: Diagnosis not present

## 2018-08-08 DIAGNOSIS — N939 Abnormal uterine and vaginal bleeding, unspecified: Secondary | ICD-10-CM | POA: Insufficient documentation

## 2018-08-08 NOTE — Patient Instructions (Signed)
Medication Instructions:  Your physician recommends that you continue on your current medications as directed. Please refer to the Current Medication list given to you today.  If you need a refill on your cardiac medications before your next appointment, please call your pharmacy.   Lab work: Please have your PCP send [place] your LIPID panel.  If you have labs (blood work) drawn today and your tests are completely normal, you will receive your results only by: Marland Kitchen MyChart Message (if you have MyChart) OR . A paper copy in the mail If you have any lab test that is abnormal or we need to change your treatment, we will call you to review the results.  Testing/Procedures: Your physician has requested that you have an echocardiogram. Echocardiography is a painless test that uses sound waves to create images of your heart. It provides your doctor with information about the size and shape of your heart and how well your heart's chambers and valves are working. This procedure takes approximately one hour. There are no restrictions for this procedure. You may get an IV, if needed, to receive an ultrasound enhancing agent through to better visualize your heart.     Follow-Up: At [facility], you and your health needs are our priority.  As part of our continuing mission to provide you with exceptional heart care, we have created designated Provider Care Teams.  These Care Teams include your primary Cardiologist (physician) and Advanced Practice Providers (APPs -  Physician Assistants and Nurse Practitioners) who all work together to provide you with the care you need, when you need it. You will need a follow up appointment in 4 months.  Please call our office 2 months in advance to schedule this appointment.  You may see DR Cristal Deer END or one of the following Advanced Practice Providers on your designated Care Team:   Nicolasa Ducking, NP Eula Listen, PA-C . Marisue Ivan,  PA-C     Echocardiogram An echocardiogram is a procedure that uses painless sound waves (ultrasound) to produce an image of the heart. Images from an echocardiogram can provide important information about:  Signs of coronary artery disease (CAD).  Aneurysm detection. An aneurysm is a weak or damaged part of an artery wall that bulges out from the normal force of blood pumping through the body.  Heart size and shape. Changes in the size or shape of the heart can be associated with certain conditions, including heart failure, aneurysm, and CAD.  Heart muscle function.  Heart valve function.  Signs of a past heart attack.  Fluid buildup around the heart.  Thickening of the heart muscle.  A tumor or infectious growth around the heart valves. Tell a health care provider about:  Any allergies you have.  All medicines you are taking, including vitamins, herbs, eye drops, creams, and over-the-counter medicines.  Any blood disorders you have.  Any surgeries you have had.  Any medical conditions you have.  Whether you are pregnant or may be pregnant. What are the risks? Generally, this is a safe procedure. However, problems may occur, including:  Allergic reaction to dye (contrast) that may be used during the procedure. What happens before the procedure? No specific preparation is needed. You may eat and drink normally. What happens during the procedure?   An IV tube may be inserted into one of your veins.  You may receive contrast through this tube. A contrast is an injection that improves the quality of the pictures from your heart.  A  gel will be applied to your chest.  A wand-like tool (transducer) will be moved over your chest. The gel will help to transmit the sound waves from the transducer.  The sound waves will harmlessly bounce off of your heart to allow the heart images to be captured in real-time motion. The images will be recorded on a computer. The  procedure may vary among health care providers and hospitals. What happens after the procedure?  You may return to your normal, everyday life, including diet, activities, and medicines, unless your health care provider tells you not to do that. Summary  An echocardiogram is a procedure that uses painless sound waves (ultrasound) to produce an image of the heart.  Images from an echocardiogram can provide important information about the size and shape of your heart, heart muscle function, heart valve function, and fluid buildup around your heart.  You do not need to do anything to prepare before this procedure. You may eat and drink normally.  After the echocardiogram is completed, you may return to your normal, everyday life, unless your health care provider tells you not to do that. This information is not intended to replace advice given to you by your health care provider. Make sure you discuss any questions you have with your health care provider. Document Released: 06/12/2000 Document Revised: 07/18/2016 Document Reviewed: 07/18/2016 Elsevier Interactive Patient Education  2019 [employer].

## 2018-08-10 MED ORDER — ATORVASTATIN CALCIUM 20 MG PO TABS: 20.0000 mg | ORAL_TABLET | Freq: Every day | ORAL | 3 refills | Status: DC

## 2018-08-10 NOTE — Telephone Encounter (Signed)
Outside labs reviewed.  Notable findings include LDL of 115.  Given history of mild-moderate, nonobstructive coronary artery disease by catheterization in 2015, I recommend that we consider adding statin for a target LDL of less than 100 (ideally less than 70).  If she is agreeable, I recommend starting atorvastatin 20 mg daily with repeat lipid panel and ALT in about 3 months.  Yvonne Kendall, MD [facility] Pager: [phone]

## 2018-08-10 NOTE — Telephone Encounter (Signed)
Patient verbalized understanding to start atorvastatin 20 mg daily. She can get lab work through her work for free. She said she does not need an order, she just needs me to send her what labs are needed. I will send a message through MyChart for this. She is aware to be fasting. Rx sent to pharmacy. She was very Adult nurse.

## 2018-08-10 NOTE — Addendum Note (Signed)
Addended by: Stann Mainland on: 08/10/2018 01:20 PM   Modules accepted: Orders

## 2018-08-18 ENCOUNTER — Ambulatory Visit (INDEPENDENT_AMBULATORY_CARE_PROVIDER_SITE_OTHER): Payer: BLUE CROSS/BLUE SHIELD | Admitting: Allergy and Immunology

## 2018-08-18 ENCOUNTER — Encounter: Payer: Self-pay | Admitting: Allergy and Immunology

## 2018-08-18 VITALS — BP 136/86 | HR 80 | Resp 18

## 2018-08-18 DIAGNOSIS — J3089 Other allergic rhinitis: Secondary | ICD-10-CM | POA: Diagnosis not present

## 2018-08-18 DIAGNOSIS — J4540 Moderate persistent asthma, uncomplicated: Secondary | ICD-10-CM | POA: Diagnosis not present

## 2018-08-18 DIAGNOSIS — K219 Gastro-esophageal reflux disease without esophagitis: Secondary | ICD-10-CM

## 2018-08-18 NOTE — Progress Notes (Signed)
Follow-up Note  Referring Provider: Hal Morales, NP Primary Provider: Hal Morales, NP Date of Office Visit: 08/18/2018  Subjective:   Stephanie Mullen (DOB: Apr 29, 1956) is a 63 y.o. female who [facility] in re-evaluation of the following:  HPI: Carter presents to this clinic in evaluation of her persistent respiratory tract symptoms including chronic cough and throat clearing and tickle in her throat and posttussive emesis and sneezing and nasal congestion and issues with reflux.  I last saw her in his clinic during her initial evaluation of 28 July 2018 at which point in time we assigned a plan to address all these issues.  She has resolved her cough.  She has no issues with her throat.  She has no issues with her nose.  She has no burning in her chest.  She does not need to use a short acting bronchodilator.  Amina has continued to utilize therapy directed against reflux and inflammation of her airway.  She has eliminated almost all of her caffeine and chocolate consumption.  Allergies as of 08/18/2018      Reactions   Celebrex [celecoxib] Rash      Medication List      acetaminophen 325 MG tablet Commonly known as:  TYLENOL Take 650 mg by mouth every 6 (six) hours as needed.   albuterol 108 (90 Base) MCG/ACT inhaler Commonly known as:  PROVENTIL HFA;VENTOLIN HFA Inhale 2 puffs into the lungs every 6 (six) hours as needed for wheezing or shortness of breath.   amLODipine 5 MG tablet Commonly known as:  NORVASC Take 5 mg by mouth daily.   atorvastatin 20 MG tablet Commonly known as:  LIPITOR Take 1 tablet (20 mg total) by mouth daily at 6 PM.   budesonide-formoterol 160-4.5 MCG/ACT inhaler Commonly known as:  SYMBICORT Inhale 2 puffs into the lungs twice daily with spacer   ESCITALOPRAM OXALATE PO Take by mouth.   escitalopram 10 MG tablet Commonly known as:  LEXAPRO Take 10 mg by mouth daily.     esomeprazole 40 MG capsule Commonly known as:  NEXIUM Take 1 capsule by mouth daily in the morning   famotidine 40 MG tablet Commonly known as:  PEPCID Take 1 tablet by mouth daily in the afternoon   triamcinolone 55 MCG/ACT Aero nasal inhaler Commonly known as:  NASACORT ALLERGY 24HR Use 1 spray in each nostril once daily       Past Medical History:  Diagnosis Date  . Asthma   . Coronary artery disease   . GERD (gastroesophageal reflux disease)   . Hypertension     Past Surgical History:  Procedure Laterality Date  . BACK SURGERY    . CARDIAC CATHETERIZATION  2015  . TONSILLECTOMY      Review of systems negative except as noted in HPI / PMHx or noted below:  Review of Systems  Constitutional: Negative.   HENT: Negative.   Eyes: Negative.   Respiratory: Negative.   Cardiovascular: Negative.   Gastrointestinal: Negative.   Genitourinary: Negative.   Musculoskeletal: Negative.   Skin: Negative.   Neurological: Negative.   Endo/Heme/Allergies: Negative.   Psychiatric/Behavioral: Negative.      Objective:   Vitals:   08/18/18 1033  BP: 136/86  Pulse: 80  Resp: 18  SpO2: 97%          Physical Exam Constitutional:      Appearance: She is not diaphoretic.  HENT:  Head: Normocephalic.     Right Ear: Tympanic membrane, ear canal and external ear normal.     Left Ear: Tympanic membrane, ear canal and external ear normal.     Nose: Nose normal. No mucosal edema or rhinorrhea.     Mouth/Throat:     Pharynx: Uvula midline. No oropharyngeal exudate.  Eyes:     Conjunctiva/sclera: Conjunctivae normal.  Neck:     Thyroid: No thyromegaly.     Trachea: Trachea normal. No tracheal tenderness or tracheal deviation.  Cardiovascular:     Rate and Rhythm: Normal rate and regular rhythm.     Heart sounds: Normal heart sounds, S1 normal and S2 normal. No murmur.  Pulmonary:     Effort: No respiratory distress.     Breath sounds: Normal breath sounds. No  stridor. No wheezing or rales.  Lymphadenopathy:     Head:     Right side of head: No tonsillar adenopathy.     Left side of head: No tonsillar adenopathy.     Cervical: No cervical adenopathy.  Skin:    Findings: No erythema or rash.     Nails: There is no clubbing.   Neurological:     Mental Status: She is alert.     Diagnostics:    Spirometry was performed and demonstrated an FEV1 of 2.57 at 118 % of predicted.  Her previous FEV1 was 1.58.  The patient had an Asthma Control Test with the following results: ACT Total Score: 22.    Assessment and Plan:   1. Asthma, moderate persistent, well-controlled   2. Perennial allergic rhinitis   3. LPRD (laryngopharyngeal reflux disease)     1. Continue to Treat and prevent inflammation:   A.  Symbicort 160 -2 inhalations twice a day with spacer  B.  OTC Nasacort -1 spray each nostril once a day  2.  Continue to Treat and prevent reflux:   A.  Consolidate caffeine and chocolate consumption  B.  Nexium 40 mg tablet in a.m.  C.  Famotidine 40 mg tablet in p.m.  3.  If needed:   A.  OTC antihistamine  B.  Proventil HFA or similar 2 inhalations every 4-6 hours  4. Return to clinic in 9 weeks or earlier if problem  Anoushka has had an excellent response to a combination of therapy directed against respiratory tract inflammation and reflux.  She will remain on this plan for a total of 12 weeks.  Thus, I will see her back in this clinic in 9 weeks.  There may be an opportunity to consolidate her treatment at that point in time.  Laurette Schimke, MD Allergy / Immunology [facility]

## 2018-08-18 NOTE — Patient Instructions (Signed)
  1. Continue to Treat and prevent inflammation:   A.  Symbicort 160 -2 inhalations twice a day with spacer  B.  OTC Nasacort -1 spray each nostril once a day  2.  Continue to Treat and prevent reflux:   A.  Consolidate caffeine and chocolate consumption  B.  Nexium 40 mg tablet in a.m.  C.  Famotidine 40 mg tablet in p.m.  3.  If needed:   A.  OTC antihistamine  B.  Proventil HFA or similar 2 inhalations every 4-6 hours  4. Return to clinic in 9 weeks or earlier if problem

## 2018-08-22 ENCOUNTER — Encounter: Payer: Self-pay | Admitting: Allergy and Immunology

## 2018-09-09 ENCOUNTER — Other Ambulatory Visit: Payer: Self-pay

## 2018-09-09 ENCOUNTER — Ambulatory Visit (INDEPENDENT_AMBULATORY_CARE_PROVIDER_SITE_OTHER): Payer: BLUE CROSS/BLUE SHIELD

## 2018-09-09 DIAGNOSIS — R0789 Other chest pain: Secondary | ICD-10-CM | POA: Diagnosis not present

## 2018-09-09 DIAGNOSIS — R0609 Other forms of dyspnea: Secondary | ICD-10-CM | POA: Diagnosis not present

## 2018-09-12 ENCOUNTER — Telehealth: Payer: Self-pay | Admitting: *Deleted

## 2018-09-12 NOTE — Telephone Encounter (Signed)
-----   Message from Yvonne Kendall, MD sent at 09/12/2018  6:48 AM EDT ----- Please let Stephanie Mullen know that her echo shows that her heart is contracting normally.  I recommend that she continue her current medications and f/u as previously discussed.  If her symptoms recur/worsen despite remaining on Nexium, she should contact us to discuss additional testing.

## 2018-09-12 NOTE — Telephone Encounter (Signed)
No answer. Left message to call back.   

## 2018-09-20 NOTE — Telephone Encounter (Signed)
Pt has reviewed results in My Chart.  

## 2018-10-19 ENCOUNTER — Ambulatory Visit (INDEPENDENT_AMBULATORY_CARE_PROVIDER_SITE_OTHER): Payer: BLUE CROSS/BLUE SHIELD | Admitting: Allergy and Immunology

## 2018-10-19 ENCOUNTER — Other Ambulatory Visit: Payer: Self-pay

## 2018-10-19 ENCOUNTER — Encounter: Payer: Self-pay | Admitting: Allergy and Immunology

## 2018-10-19 DIAGNOSIS — K219 Gastro-esophageal reflux disease without esophagitis: Secondary | ICD-10-CM

## 2018-10-19 DIAGNOSIS — J4540 Moderate persistent asthma, uncomplicated: Secondary | ICD-10-CM | POA: Diagnosis not present

## 2018-10-19 DIAGNOSIS — J3089 Other allergic rhinitis: Secondary | ICD-10-CM | POA: Diagnosis not present

## 2018-10-19 NOTE — Progress Notes (Signed)
[place] [facility] [place] [facility] [place]   Follow-up Note  Referring Provider: Hal Morales, NP Primary Provider: Hal Morales, NP Date of Office Visit: 10/19/2018  Subjective:   Stephanie Mullen (DOB: September 18, 1955) is a 63 y.o. female who [facility] on 10[facility]19/2018 in re-evaluation of the following:  HPI: This is a e- med visit requested by patient who is located at home.  Stephanie Mullen is followed in this clinic for asthma and allergic rhinitis and LPR.  Her last visit to this clinic was 18 August 2018.  Developed influenza A infection that she contracted from her dad early March 2020 treated with Tamiflu successfully.  Asthma much improved. Rare use of SABA. Coughing gone and no SOB.   No throat clearing or reflux. No caffeine consumption.   Allergies as of 10/19/2018      Reactions   Celebrex [celecoxib] Rash      Medication List      acetaminophen 325 MG tablet Commonly known as:  TYLENOL Take 650 mg by mouth every 6 (six) hours as needed.   albuterol 108 (90 Base) MCG/ACT inhaler Commonly known as:  VENTOLIN HFA Inhale 2 puffs into the lungs every 6 (six) hours as needed for wheezing or shortness of breath.   amLODipine 5 MG tablet Commonly known as:  NORVASC Take 5 mg by mouth daily.   atorvastatin 20 MG tablet Commonly known as:  LIPITOR Take 1 tablet (20 mg total) by mouth daily at 6 PM.   budesonide-formoterol 160-4.5 MCG/ACT inhaler Commonly known as:  Symbicort Inhale 2 puffs into the lungs twice daily with spacer   esomeprazole 40 MG capsule Commonly known as:  NexIUM Take 1 capsule by mouth daily in the morning   famotidine 40 MG tablet Commonly known as:  PEPCID Take 1 tablet by mouth daily in the afternoon   triamcinolone 55 MCG/ACT Aero nasal inhaler Commonly known as:  Nasacort Allergy 24HR Use 1 spray in each nostril once daily       Past Medical History:  Diagnosis Date  . Asthma   .  Coronary artery disease   . GERD (gastroesophageal reflux disease)   . Hypertension     Past Surgical History:  Procedure Laterality Date  . BACK SURGERY    . CARDIAC CATHETERIZATION  2015  . TONSILLECTOMY      Review of systems negative except as noted in HPI / PMHx or noted below:  Review of Systems  Constitutional: Negative.   HENT: Negative.   Eyes: Negative.   Respiratory: Negative.   Cardiovascular: Negative.   Gastrointestinal: Negative.   Genitourinary: Negative.   Musculoskeletal: Negative.   Skin: Negative.   Neurological: Negative.   Endo/Heme/Allergies: Negative.   Psychiatric/Behavioral: Negative.      Objective:   There were no vitals filed for this visit.        Physical Exam-deferred  Diagnostics:   Results of an echocardiogram obtained 09 September 2018 identified the following:  1. The left ventricle has normal systolic function with an ejection fraction of 60-65%. The cavity size was normal. There is mildly increased left ventricular wall thickness. Left ventricular diastolic Doppler parameters are consistent with impaired  relaxation.  2. The right ventricle has normal systolic function. The cavity was normal. There is no increase in right ventricular wall thickness. Unable to estimate RVSP  Assessment and Plan:   1. Asthma, moderate persistent, well-controlled   2. Perennial allergic rhinitis  3. LPRD (laryngopharyngeal reflux disease)     1. Continue to Treat and prevent inflammation:   A.  Symbicort 160 -2 inhalations twice a day with spacer  B.  OTC Nasacort -1 spray each nostril once a day  2.  Continue to Treat and prevent reflux:   A.  Consolidate caffeine and chocolate consumption  B.  Nexium 40 mg tablet in a.m.  C.  Discontinue Famotidine    3.  If needed:   A.  OTC antihistamine  B.  Proventil HFA or similar 2 inhalations every 4-6 hours  4. Return to clinic in 12 weeks or earlier if problem  Overall, Stephanie Mullen has done  well with her current plan of anti-inflammatory medications for her airway and therapy directed against reflux.  She has done so well that there may be an opportunity to consolidate some of her treatment and we will remove her H2 receptor blocker while she continues on a combination of Symbicort and Nasacort and Nexium.  If she does well over the course of the next 12 weeks we will see if we can further consolidate her treatment with that visit.  She will contact me during the interval should she have any significant problems.  Total patient interaction time 21 minutes  Laurette SchimkeEric , MD Allergy / Immunology [facility]

## 2018-10-19 NOTE — Patient Instructions (Addendum)
  1. Continue to Treat and prevent inflammation:   A.  Symbicort 160 -2 inhalations twice a day with spacer  B.  OTC Nasacort -1 spray each nostril once a day  2.  Continue to Treat and prevent reflux:   A.  Consolidate caffeine and chocolate consumption  B.  Nexium 40 mg tablet in a.m.  C.  Discontinue Famotidine    3.  If needed:   A.  OTC antihistamine  B.  Proventil HFA or similar 2 inhalations every 4-6 hours  4. Return to clinic in 12 weeks or earlier if problem

## 2018-10-20 ENCOUNTER — Encounter: Payer: Self-pay | Admitting: Allergy and Immunology

## 2018-10-25 NOTE — Progress Notes (Signed)
Patient is at home.  Provider is in the office.  Start time: 9:30 am End Time: 9:42 am Verbal consent to [employer] given.

## 2018-11-02 ENCOUNTER — Telehealth: Payer: Self-pay | Admitting: *Deleted

## 2018-11-02 NOTE — Telephone Encounter (Signed)
Patient currently has appointment on 12/07/18. Called patient and left message to call back and potentially schedule virtual visit for month of May instead.  No answer. Left message to call back.

## 2018-11-08 ENCOUNTER — Telehealth: Payer: Self-pay

## 2018-11-08 NOTE — Telephone Encounter (Signed)
Copied from CRM [id number]. Topic: Appointment Scheduling - New Patient >> Nov 07, 2018  4:51 PM Terisa Starr wrote: Patient would like to establish with [facility]. She has [employer]. Please Advise.

## 2018-11-10 ENCOUNTER — Ambulatory Visit: Payer: Managed Care, Other (non HMO)

## 2018-11-10 ENCOUNTER — Ambulatory Visit (INDEPENDENT_AMBULATORY_CARE_PROVIDER_SITE_OTHER): Payer: Managed Care, Other (non HMO) | Admitting: Family Medicine

## 2018-11-10 ENCOUNTER — Other Ambulatory Visit: Payer: Self-pay

## 2018-11-10 ENCOUNTER — Encounter: Payer: Self-pay | Admitting: Family Medicine

## 2018-11-10 VITALS — BP 126/72 | HR 79 | Temp 98.8°F | Resp 16 | Ht 61.0 in | Wt 230.0 lb

## 2018-11-10 DIAGNOSIS — E782 Mixed hyperlipidemia: Secondary | ICD-10-CM

## 2018-11-10 DIAGNOSIS — J45909 Unspecified asthma, uncomplicated: Secondary | ICD-10-CM | POA: Insufficient documentation

## 2018-11-10 DIAGNOSIS — R42 Dizziness and giddiness: Secondary | ICD-10-CM

## 2018-11-10 DIAGNOSIS — I2510 Atherosclerotic heart disease of native coronary artery without angina pectoris: Secondary | ICD-10-CM

## 2018-11-10 DIAGNOSIS — I10 Essential (primary) hypertension: Secondary | ICD-10-CM | POA: Diagnosis not present

## 2018-11-10 DIAGNOSIS — E785 Hyperlipidemia, unspecified: Secondary | ICD-10-CM | POA: Insufficient documentation

## 2018-11-10 DIAGNOSIS — J4520 Mild intermittent asthma, uncomplicated: Secondary | ICD-10-CM

## 2018-11-10 DIAGNOSIS — R7303 Prediabetes: Secondary | ICD-10-CM

## 2018-11-10 MED ORDER — ATORVASTATIN CALCIUM 20 MG PO TABS: 20.0000 mg | ORAL_TABLET | Freq: Every day | ORAL | 3 refills | Status: DC

## 2018-11-10 MED ORDER — AMLODIPINE BESYLATE 5 MG PO TABS: 5.0000 mg | ORAL_TABLET | Freq: Every day | ORAL | 3 refills | Status: DC

## 2018-11-10 NOTE — Progress Notes (Signed)
I connected with Stephanie Mullen on 11/10/18 at  3:20 PM EDT by video and verified that I am speaking with the correct person using two identifiers.   I discussed the limitations, risks, security and privacy concerns of performing an evaluation and management service by video and the availability of in person appointments. I also discussed with the patient that there may be a patient responsible charge related to this service. The patient expressed understanding and agreed to proceed.  Patient location: Home Provider Location: [facility] Participants: Stephanie Mullen and Stephanie Mullen   Subjective:     Stephanie Mullen is a 63 y.o. female presenting for Establish Care ([facility]. Also sees Stephanie Mullen-asthma/allergy specialist.); Hypertension (has been checking her b/p 178/80 on 10/23/2018, 118/73 on 5/2. on average have been 160s-150s/80s-90s); and Dizziness (gets symptoms of dizziness with moving her head side to side. This is better than it was 3 weeks ago.)     Hypertension  Associated symptoms include shortness of breath (baseline). Pertinent negatives include no chest pain or headaches.  Dizziness  Pertinent negatives include no chest pain or headaches.    #HTN - averaging 150-160/80-90 at home - takes amlodipine 5 mg daily - NSAID use occasionally 1 time a week at the most, usually just one time - has CAD and is on atorvastatin - feels like it goes up and down. Was very high a couple of weeks ago   #dizziness - with moving head from side to side - is improving - occasional episodes - before was lasting for a long time - feels kind of like when you are going   #Asthma - on inhaler - does not ever need the rescue inhaler - on symbicort which has helped significantly with breathing and cough  #GERD - taking pecid initially  #HLD - started by cardiology - recommended starting he medication    Review of Systems   Eyes: Negative for visual disturbance.  Respiratory: Positive for shortness of breath (baseline).   Cardiovascular: Negative for chest pain.  Neurological: Positive for dizziness. Negative for headaches.     Social History   Tobacco Use  Smoking Status Never Smoker  Smokeless Tobacco Never Used        Objective:   BP Readings from Last 3 Encounters:  11/10/18 126/72  08/18/18 136/86  08/08/18 126/82   Wt Readings from Last 3 Encounters:  11/10/18 230 lb (104.3 kg)  08/08/18 226 lb 4 oz (102.6 kg)  07/28/18 228 lb 3.2 oz (103.5 kg)    BP 126/72   Pulse 79   Temp 98.8 F (37.1 C)   Resp 16   Ht 5\' 1"  (1.549 m) Comment: per patient  Wt 230 lb (104.3 kg) Comment: per patient  SpO2 96%   BMI 43.46 kg/m    Physical Exam Constitutional:      Appearance: Normal appearance. She is obese. She is not ill-appearing.  HENT:     Head: Normocephalic and atraumatic.     Right Ear: External ear normal.     Left Ear: External ear normal.  Eyes:     Conjunctiva/sclera: Conjunctivae normal.  Cardiovascular:     Rate and Rhythm: Normal rate.  Pulmonary:     Effort: Pulmonary effort is normal. No respiratory distress.  Neurological:     Mental Status: She is alert. Mental status is at baseline.  Psychiatric:        Mood and Affect: Mood  normal.        Behavior: Behavior normal.        Thought Content: Thought content normal.        Judgment: Judgment normal.            Assessment & Plan:   Problem List Items Addressed This Visit      Cardiovascular and Mediastinum   Essential hypertension - Primary    Suspect she may not have a well working BP cuff at home. Advised home monitoring. If continuing to be elevated - recommended that she bring home cuff to the office to check with our BP measurement. Cont amlodipine.       Relevant Medications   amLODipine (NORVASC) 5 MG tablet   atorvastatin (LIPITOR) 20 MG tablet     Respiratory   Asthma    Diagnosed by  asthma center when she was getting worked up for atypical chest pain. Also on symbicort. Will have occasional coughing spells but mostly controlled.         Other   CAD in native artery   Relevant Medications   atorvastatin (LIPITOR) 20 MG tablet   Hyperlipidemia    ASCVD risk 6.5%, however, with known CAD. Recommend treatment due to CAD and patient agreed to continue.       Relevant Medications   amLODipine (NORVASC) 5 MG tablet   atorvastatin (LIPITOR) 20 MG tablet   Prediabetes    Working on exercise. Briefly discussed metformin, but patient not interested at this time. Will repeat HgbA1c annually      Vertigo    Resolving, suspect BPPV. Advised Epley Maneuver if they return to try at home.           Return in about 6 months (around 05/13/2019) for annual.  Stephanie ChildJessica R , MD

## 2018-11-10 NOTE — Assessment & Plan Note (Signed)
ASCVD risk 6.5%, however, with known CAD. Recommend treatment due to CAD and patient agreed to continue.

## 2018-11-10 NOTE — Assessment & Plan Note (Signed)
Working on exercise. Briefly discussed metformin, but patient not interested at this time. Will repeat HgbA1c annually

## 2018-11-10 NOTE — Assessment & Plan Note (Signed)
Diagnosed by asthma center when she was getting worked up for atypical chest pain. Also on symbicort. Will have occasional coughing spells but mostly controlled.

## 2018-11-10 NOTE — Assessment & Plan Note (Signed)
Resolving, suspect BPPV. Advised Epley Maneuver if they return to try at home.

## 2018-11-10 NOTE — Assessment & Plan Note (Signed)
Suspect she may not have a well working BP cuff at home. Advised home monitoring. If continuing to be elevated - recommended that she bring home cuff to the office to check with our BP measurement. Cont amlodipine.

## 2018-11-10 NOTE — Patient Instructions (Addendum)
If the dizziness returns, you can try the Epley Maneuver.   You should be able to find videos and hand outs online which go over this.

## 2018-11-17 NOTE — Telephone Encounter (Signed)
Per scheduler note, patient had to cancel because her insurance changed.

## 2018-12-07 ENCOUNTER — Ambulatory Visit: Payer: BLUE CROSS/BLUE SHIELD | Admitting: Internal Medicine

## 2018-12-19 NOTE — Telephone Encounter (Signed)
Pt seen 11/10/18

## 2019-09-07 ENCOUNTER — Encounter: Payer: Self-pay | Admitting: Family Medicine

## 2019-09-07 ENCOUNTER — Ambulatory Visit (INDEPENDENT_AMBULATORY_CARE_PROVIDER_SITE_OTHER): Payer: Medicare Other | Admitting: Family Medicine

## 2019-09-07 ENCOUNTER — Other Ambulatory Visit: Payer: Self-pay

## 2019-09-07 VITALS — BP 132/84 | HR 80 | Temp 98.1°F | Ht 61.0 in | Wt 224.5 lb

## 2019-09-07 DIAGNOSIS — Z1231 Encounter for screening mammogram for malignant neoplasm of breast: Secondary | ICD-10-CM

## 2019-09-07 DIAGNOSIS — Z1159 Encounter for screening for other viral diseases: Secondary | ICD-10-CM

## 2019-09-07 DIAGNOSIS — R7303 Prediabetes: Secondary | ICD-10-CM | POA: Diagnosis not present

## 2019-09-07 DIAGNOSIS — Z0000 Encounter for general adult medical examination without abnormal findings: Secondary | ICD-10-CM | POA: Diagnosis not present

## 2019-09-07 DIAGNOSIS — Z13820 Encounter for screening for osteoporosis: Secondary | ICD-10-CM

## 2019-09-07 DIAGNOSIS — E782 Mixed hyperlipidemia: Secondary | ICD-10-CM

## 2019-09-07 DIAGNOSIS — I10 Essential (primary) hypertension: Secondary | ICD-10-CM | POA: Diagnosis not present

## 2019-09-07 DIAGNOSIS — Z1211 Encounter for screening for malignant neoplasm of colon: Secondary | ICD-10-CM

## 2019-09-07 DIAGNOSIS — E2839 Other primary ovarian failure: Secondary | ICD-10-CM

## 2019-09-07 LAB — LIPID PANEL
Cholesterol: 110 mg/dL (ref 0–200)
HDL: 44 mg/dL (ref >39.00)
LDL Cholesterol: 42 mg/dL (ref 0–99)
NonHDL: 66.44
Total CHOL/HDL Ratio: 3
Triglycerides: 124 mg/dL (ref 0.0–149.0)
VLDL: 24.8 mg/dL (ref 0.0–40.0)

## 2019-09-07 LAB — COMPREHENSIVE METABOLIC PANEL
ALT: 20 U/L (ref 0–35)
AST: 17 U/L (ref 0–37)
Albumin: 4 g/dL (ref 3.5–5.2)
Alkaline Phosphatase: 123 U/L — ABNORMAL HIGH (ref 39–117)
BUN: 14 mg/dL (ref 6–23)
CO2: 30 mEq/L (ref 19–32)
Calcium: 9.6 mg/dL (ref 8.4–10.5)
Chloride: 104 mEq/L (ref 96–112)
Creatinine, Ser: 0.83 mg/dL (ref 0.40–1.20)
GFR: 68.93 mL/min (ref >60.00)
Glucose, Bld: 96 mg/dL (ref 70–99)
Potassium: 4.6 mEq/L (ref 3.5–5.1)
Sodium: 139 mEq/L (ref 135–145)
Total Bilirubin: 0.9 mg/dL (ref 0.2–1.2)
Total Protein: 7.1 g/dL (ref 6.0–8.3)

## 2019-09-07 LAB — CBC
HCT: 38.7 % (ref 36.0–46.0)
Hemoglobin: 12.9 g/dL (ref 12.0–15.0)
MCHC: 33.3 g/dL (ref 30.0–36.0)
MCV: 86.2 fl (ref 78.0–100.0)
Platelets: 199 10*3/uL (ref 150.0–400.0)
RBC: 4.49 Mil/uL (ref 3.87–5.11)
RDW: 14.2 % (ref 11.5–15.5)
WBC: 8.3 10*3/uL (ref 4.0–10.5)

## 2019-09-07 LAB — HEMOGLOBIN A1C: Hgb A1c MFr Bld: 6.1 % (ref 4.6–6.5)

## 2019-09-07 NOTE — Addendum Note (Signed)
Addended by: Aquilla Solian on: 09/07/2019 10:23 AM   Modules accepted: Orders

## 2019-09-07 NOTE — Progress Notes (Signed)
Subjective:    Stephanie Mullen is a 64 y.o. female who presents for a Welcome to Medicare exam.   Review of Systems Review of Systems  Constitutional: Negative.   HENT: Negative.   Eyes: Negative.   Respiratory: Negative.   Cardiovascular: Positive for chest pain (following covid shot).  Gastrointestinal: Negative.   Genitourinary: Negative.   Musculoskeletal:       Muscle soreness  Skin: Negative.   Neurological: Positive for tingling (in the left arm where she got her shot).  Endo/Heme/Allergies: Negative.   Psychiatric/Behavioral: Negative.    Will get occasional anxiety attacks where she feels tachycardia  Cardiac Risk Factors include: advanced age (>45men, >32 women);sedentary lifestyle      Objective:    Today's Vitals   09/07/19 0919 09/07/19 0926  BP: 132/84   Pulse: 80   Temp: 98.1 F (36.7 C)   SpO2: 97%   Weight: 224 lb 8 oz (101.8 kg)   Height: 5\' 1"  (1.549 m)   PainSc:  2   Body mass index is 42.42 kg/m.  Medications Outpatient Encounter Medications as of 09/07/2019  Medication Sig  . acetaminophen (TYLENOL) 325 MG tablet Take 650 mg by mouth every 6 (six) hours as needed.  Marland Kitchen amLODipine (NORVASC) 5 MG tablet Take 1 tablet (5 mg total) by mouth daily.  Marland Kitchen atorvastatin (LIPITOR) 20 MG tablet Take 1 tablet (20 mg total) by mouth daily at 6 PM.  . esomeprazole (NEXIUM) 40 MG capsule Take 1 capsule by mouth daily in the morning  . fluticasone (FLONASE) 50 MCG/ACT nasal spray Place 2 sprays into both nostrils daily as needed for allergies or rhinitis.  . naproxen sodium (ALEVE) 220 MG tablet Take 220 mg by mouth 2 (two) times daily as needed.  . [DISCONTINUED] albuterol (PROVENTIL HFA;VENTOLIN HFA) 108 (90 Base) MCG/ACT inhaler Inhale 2 puffs into the lungs every 6 (six) hours as needed for wheezing or shortness of breath.  . [DISCONTINUED] budesonide-formoterol (SYMBICORT) 160-4.5 MCG/ACT inhaler Inhale 2 puffs into the lungs twice daily with spacer  .  [DISCONTINUED] triamcinolone (NASACORT ALLERGY 24HR) 55 MCG/ACT AERO nasal inhaler Use 1 spray in each nostril once daily   No facility-administered encounter medications on file as of 09/07/2019.     History: Past Medical History:  Diagnosis Date  . Arthritis   . Asthma   . Coronary artery disease   . GERD (gastroesophageal reflux disease)   . Hypertension    Past Surgical History:  Procedure Laterality Date  . BACK SURGERY    . CARDIAC CATHETERIZATION  2015  . TONSILLECTOMY      Family History  Problem Relation Age of Onset  . Asthma Mother   . Diabetes Mother   . Allergies Father   . Heart disease Father        has several blockages  . Cystic fibrosis Sister   . Obesity Brother        morbid  . Sleep apnea Brother   . Rheum arthritis Paternal Grandfather    Social History   Occupational History  . Not on file  Tobacco Use  . Smoking status: Never Smoker  . Smokeless tobacco: Never Used  Substance and Sexual Activity  . Alcohol use: Yes    Comment: 1-2 times a year, 1 drink  . Drug use: Never  . Sexual activity: Not Currently    Birth control/protection: Post-menopausal    Tobacco Counseling Counseling given: Not Answered n/a  Immunizations and Health Maintenance Immunization  History  Administered Date(s) Administered  . Influenza, Seasonal, Injecte, Preservative Fre 04/03/2019  . PFIZER SARS-COV-2 Vaccination 08/11/2019, 09/05/2019  . Tdap 02/23/2017   Health Maintenance Due  Topic Date Due  . Hepatitis C Screening  Never done  . HIV Screening  Never done  . COLONOSCOPY  Never done  . DEXA SCAN  Never done  . PNA vac Low Risk Adult (1 of 2 - PCV13) Never done    Activities of Daily Living In your present state of health, do you have any difficulty performing the following activities: 09/07/2019  Hearing? N  Vision? N  Difficulty concentrating or making decisions? N  Walking or climbing stairs? N  Dressing or bathing? N  Doing errands,  shopping? N  Preparing Food and eating ? N  Using the Toilet? N  In the past six months, have you accidently leaked urine? N  Do you have problems with loss of bowel control? N  Managing your Medications? N  Managing your Finances? N  Housekeeping or managing your Housekeeping? N  Some recent data might be hidden    Physical Exam  (optional), or other factors deemed appropriate based on the beneficiary's medical and social history and current clinical standards.  Advanced Directives: Does Patient Have a Medical Advance Directive?: No Would patient like information on creating a medical advance directive?: Yes (MAU/Ambulatory/Procedural Areas - Information given)    Assessment:    This is a routine wellness examination for this patient .   Vision/Hearing screen  Hearing Screening   125Hz  250Hz  500Hz  1000Hz  2000Hz  3000Hz  4000Hz  6000Hz  8000Hz   Right ear:   25 25 25  25     Left ear:   25 25 25  25     Vision Screening Comments: Had eye exam in November 2020 at [facility]  Dietary issues and exercise activities discussed:  Current Exercise Habits: The patient does not participate in regular exercise at present, Exercise limited by: Other - see comments(working on starting)  Goals    . Exercise 3x per week (30 min per time) (pt-stated)     Get outside and walk the [place] circle. And hike around [place] hike      Depression Screen PHQ 2/9 Scores 09/07/2019 11/10/2018  PHQ - 2 Score 0 0  PHQ- 9 Score - 2     Fall Risk Fall Risk  09/07/2019  Falls in the past year? 0  Number falls in past yr: 0  Injury with Fall? 0    Cognitive Function:     6CIT Screen 09/07/2019  What Year? 0 points  What month? 0 points  What time? 0 points  Count back from 20 0 points  Months in reverse 0 points  Repeat phrase 0 points  Total Score 0    Patient Care Team: , MD as PCP - General (Family Medicine)   EKG: sinus rhythm, some possible q-waves but  stable compared to prior ECG      Plan:      Problem List Items Addressed This Visit      Cardiovascular and Mediastinum   Essential hypertension   Relevant Orders   Comprehensive metabolic panel   CBC     Other   Morbid obesity (HCC)   Hyperlipidemia   Relevant Orders   Lipid panel   Prediabetes   Relevant Orders   Hemoglobin A1c    Other Visit Diagnoses    Encounter for Medicare annual wellness exam    -  Primary  Relevant Orders   CBC   Colon cancer screening       Relevant Orders   Fecal occult blood, imunochemical   Need for hepatitis C screening test       Relevant Orders   Hepatitis C antibody   Screening for osteoporosis       Encounter for screening mammogram for malignant neoplasm of breast       Relevant Orders   MM 3D SCREEN BREAST BILATERAL   Estrogen deficiency       Relevant Orders   DG Bone Density   Welcome to Medicare preventive visit       Relevant Orders   EKG 12-Lead (Completed)     Declined HIV screening Prefers Stool testing for colon cancer screening, previous colonoscopy normal Information for scheduling mammogram and dexa provided  I have personally reviewed and noted the following in the patient's chart:   . Medical and social history . Use of alcohol, tobacco or illicit drugs  . Current medications and supplements . Functional ability and status . Nutritional status . Physical activity . Advanced directives . List of other physicians . Hospitalizations, surgeries, and ER visits in previous 12 months . Vitals . Screenings to include cognitive, depression, and falls . Referrals and appointments  In addition, I have reviewed and discussed with patient certain preventive protocols, quality metrics, and best practice recommendations. A written personalized care plan for preventive services as well as general preventive health recommendations were provided to patient.     Lynnda Child, MD 09/07/2019

## 2019-09-07 NOTE — Patient Instructions (Addendum)
  Stephanie Mullen , Thank you for taking time to come for your Medicare Wellness Visit. I appreciate your ongoing commitment to your health goals. Please review the following plan we discussed and let me know if I can assist you in the future.   These are the goals we discussed: Goals    . Exercise 3x per week (30 min per time) (pt-stated)     Get outside and walk the [place] circle. And hike around [place] hike       This is a list of the screening recommended for you and due dates:  Health Maintenance  Topic Date Due  .  Hepatitis C: One time screening is recommended by Center for Disease Control  (CDC) for  adults born from [birth year] through 1967.   Never done  . Stool Blood Test  Never done  . DEXA scan (bone density measurement)  Never done  . Pneumonia vaccines (1 of 2 - PCV13) Never done  . Mammogram  11/06/2019  . Pap Smear  05/10/2021  . Tetanus Vaccine  02/24/2027  . Flu Shot  Completed  . Colon Cancer Screening  Discontinued  . HIV Screening  Discontinued   Work on the advanced directive paperwork and return when completed  Blood work today

## 2019-09-08 ENCOUNTER — Encounter: Payer: Self-pay | Admitting: Family Medicine

## 2019-09-08 ENCOUNTER — Telehealth: Payer: Self-pay | Admitting: Family Medicine

## 2019-09-08 LAB — HEPATITIS C ANTIBODY
Hepatitis C Ab: NONREACTIVE
SIGNAL TO CUT-OFF: 0.02 (ref <1.00)

## 2019-09-08 NOTE — Telephone Encounter (Signed)
[facility] HIM Dept received 49 pages of medical records from [facility]. Sending via interoffice mail to Dr. Selena Batten at [facility]. 09/08/19  KLM

## 2019-09-11 ENCOUNTER — Other Ambulatory Visit (INDEPENDENT_AMBULATORY_CARE_PROVIDER_SITE_OTHER): Payer: Medicare Other

## 2019-09-11 DIAGNOSIS — Z1211 Encounter for screening for malignant neoplasm of colon: Secondary | ICD-10-CM | POA: Diagnosis not present

## 2019-09-11 LAB — FECAL OCCULT BLOOD, IMMUNOCHEMICAL: Fecal Occult Bld: NEGATIVE

## 2019-10-04 ENCOUNTER — Ambulatory Visit
Admission: RE | Admit: 2019-10-04 | Discharge: 2019-10-04 | Disposition: A | Discharge status: Home / Self Care | Payer: Medicare Other | Source: Ambulatory Visit | Attending: Family Medicine | Admitting: Family Medicine

## 2019-10-04 ENCOUNTER — Encounter: Payer: Self-pay | Admitting: Family Medicine

## 2019-10-04 DIAGNOSIS — M8580 Other specified disorders of bone density and structure, unspecified site: Secondary | ICD-10-CM | POA: Insufficient documentation

## 2019-10-04 DIAGNOSIS — E2839 Other primary ovarian failure: Secondary | ICD-10-CM | POA: Insufficient documentation

## 2019-12-08 DIAGNOSIS — F325 Major depressive disorder, single episode, in full remission: Secondary | ICD-10-CM | POA: Insufficient documentation

## 2019-12-08 DIAGNOSIS — K219 Gastro-esophageal reflux disease without esophagitis: Secondary | ICD-10-CM | POA: Insufficient documentation

## 2019-12-08 DIAGNOSIS — M519 Unspecified thoracic, thoracolumbar and lumbosacral intervertebral disc disorder: Secondary | ICD-10-CM | POA: Insufficient documentation

## 2019-12-08 DIAGNOSIS — J309 Allergic rhinitis, unspecified: Secondary | ICD-10-CM | POA: Insufficient documentation

## 2019-12-11 ENCOUNTER — Other Ambulatory Visit: Payer: Self-pay

## 2019-12-11 ENCOUNTER — Ambulatory Visit (INDEPENDENT_AMBULATORY_CARE_PROVIDER_SITE_OTHER): Payer: Medicare Other | Admitting: Family Medicine

## 2019-12-11 ENCOUNTER — Encounter: Payer: Self-pay | Admitting: Family Medicine

## 2019-12-11 VITALS — BP 136/80 | HR 90 | Temp 98.2°F | Ht 61.0 in | Wt 224.0 lb

## 2019-12-11 DIAGNOSIS — Z23 Encounter for immunization: Secondary | ICD-10-CM

## 2019-12-11 DIAGNOSIS — F331 Major depressive disorder, recurrent, moderate: Secondary | ICD-10-CM

## 2019-12-11 DIAGNOSIS — F5104 Psychophysiologic insomnia: Secondary | ICD-10-CM | POA: Diagnosis not present

## 2019-12-11 MED ORDER — HYDROXYZINE HCL 25 MG PO TABS: 25.0000 mg | ORAL_TABLET | Freq: Every evening | ORAL | 1 refills | Status: DC | PRN

## 2019-12-11 MED ORDER — ESCITALOPRAM OXALATE 10 MG PO TABS: 10.0000 mg | ORAL_TABLET | Freq: Every day | ORAL | 1 refills | Status: DC

## 2019-12-11 NOTE — Assessment & Plan Note (Signed)
Insomnia contributing to overall mood. She borrowed medication from daughter - hydroxyzine which was effective. Prescription provided. OK to take 1-2 tablets as needed for sleep. Avoid taking with excess antihistamine medication

## 2019-12-11 NOTE — Progress Notes (Signed)
Subjective:     Stephanie Mullen is a 64 y.o. 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 and works remotely - had a Personnel officer" the last time he with her dad - job demands are severe - is not feeling like doing anything any more  - when job is pushing her to do things she is not skilled at is anxiety but they keep pushing her - frustrated in general - has been on medicine for depression/anxiety as recent as a few years ago - this was somewhat helpful and took for 1-2 years  Lower stress Jan-April but busy time at work is May onward and she has gone from 1 to 2 weeks with her dad  # insomnia - has been taking hydroxyzine at night to help with sleep (got from her daughter) - will wake up at 2-3 am and will take melatonin   Review of Systems   Social History   Tobacco Use  Smoking Status Never Smoker  Smokeless Tobacco Never Used        Objective:    BP Readings from Last 3 Encounters:  12/11/19 136/80  09/07/19 132/84  11/10/18 126/72   Wt Readings from Last 3 Encounters:  12/11/19 224 lb (101.6 kg)  09/07/19 224 lb 8 oz (101.8 kg)  11/10/18 230 lb (104.3 kg)    BP 136/80   Pulse 90   Temp 98.2 F (36.8 C)   Ht 5\' 1"  (1.549 m)   Wt 224 lb (101.6 kg)   SpO2 96%   BMI 42.32 kg/m    Physical Exam Constitutional:      General: She is not in acute distress.    Appearance: She is well-developed. She is not diaphoretic.  HENT:     Right Ear: External ear normal.     Left Ear: External ear normal.     Nose: Nose normal.  Eyes:     Conjunctiva/sclera: Conjunctivae normal.  Cardiovascular:     Rate and Rhythm: Normal rate.  Pulmonary:     Effort: Pulmonary effort is normal.  Musculoskeletal:     Cervical back: Neck supple.  Skin:    General: Skin is warm and  dry.     Capillary Refill: Capillary refill takes less than 2 seconds.  Neurological:     Mental Status: She is alert. Mental status is at baseline.  Psychiatric:        Mood and Affect: Mood is depressed.        Behavior: Behavior normal.           Assessment & Plan:   Problem List Items Addressed This Visit      Other   Depression - Primary    Hx of depression and symptoms worsening in setting of work and caregiver stress. Start Lexapro 10 mg (previously tolerated). Return 6 weeks. Treat insomnia. Number for therapy provided and encouraged      Relevant Medications   hydrOXYzine (ATARAX/VISTARIL) 25 MG tablet   escitalopram (LEXAPRO) 10 MG tablet   Psychophysiological insomnia    Insomnia contributing to overall mood. She borrowed medication from daughter - hydroxyzine which was effective. Prescription provided. OK to take 1-2 tablets as needed for sleep. Avoid taking with excess antihistamine  medication      Relevant Medications   hydrOXYzine (ATARAX/VISTARIL) 25 MG tablet       Return in about 6 weeks (around 01/22/2020).  Lesleigh Noe, MD

## 2019-12-11 NOTE — Assessment & Plan Note (Signed)
Hx of depression and symptoms worsening in setting of work and caregiver stress. Start Lexapro 10 mg (previously tolerated). Return 6 weeks. Treat insomnia. Number for therapy provided and encouraged

## 2019-12-11 NOTE — Patient Instructions (Addendum)
You are going to start a new antidepressant medication.   One of the risks of this medication is increase in suicidal thoughts.   Your suicide Action plan is as follows:  1) Call [facility] - Friend 2) Call the Suicide Hotline (947)493-7964 which is available 24 hours 3) Call the Clinic   The most common side effect is stomach upset. If this happens it means the medication is working. It should get better in 1-3 weeks.   Medication for depression and anxiety often takes 6-8 weeks to have a noticeable difference so stick with it. Also the best way for recovery is taking medication and seeing a therapist -- this is so important.    How to help anxiety and depression  1) Regular Exercise - walking, jogging, cycling, dancing, strength training - aiming for 150 minutes of exercise a week --> Yoga has been shown in research to reduce depression and anxiety -- with even just one hour long session per week  2)  Begin a Mindfulness/Meditation practice -- this can take a little as 3 minutes and is helpful for all kinds of mood issues -- You can find resources in books -- Or you can download apps like  ---- Headspace App  ---- Calm  ---- Insignt Timer ---- Stop, Breathe & Think  # With each of these Apps - you should decline the "start free trial" offer and as you search through the App should be able to access some of their free content. You can also chose to pay for the content if you find one that works well for you.   # Many of them also offer sleep specific content which may help with insomnia  3) Healthy Diet -- Avoid or decrease Caffeine -- Avoid or decrease Alcohol -- Drink plenty of water, have a balanced diet -- Avoid cigarettes and marijuana (as well as other recreational drugs)  4) Find a therapist  -- [facility] is one option. Call [phone] -- Or you can check out www.psychologytoday.com -- you can read bios of therapists and see if they accept  insurance -- Check with your insurance to see if you have coverage and who may take your insurance

## 2020-02-06 ENCOUNTER — Encounter: Payer: Self-pay | Admitting: Family Medicine

## 2020-02-06 ENCOUNTER — Other Ambulatory Visit: Payer: Self-pay

## 2020-02-06 ENCOUNTER — Ambulatory Visit (INDEPENDENT_AMBULATORY_CARE_PROVIDER_SITE_OTHER): Payer: Medicare Other | Admitting: Family Medicine

## 2020-02-06 VITALS — BP 140/78 | HR 78 | Temp 97.7°F | Ht 61.0 in | Wt 226.5 lb

## 2020-02-06 DIAGNOSIS — I2510 Atherosclerotic heart disease of native coronary artery without angina pectoris: Secondary | ICD-10-CM

## 2020-02-06 DIAGNOSIS — E782 Mixed hyperlipidemia: Secondary | ICD-10-CM

## 2020-02-06 DIAGNOSIS — I10 Essential (primary) hypertension: Secondary | ICD-10-CM | POA: Diagnosis not present

## 2020-02-06 DIAGNOSIS — R351 Nocturia: Secondary | ICD-10-CM | POA: Insufficient documentation

## 2020-02-06 DIAGNOSIS — F331 Major depressive disorder, recurrent, moderate: Secondary | ICD-10-CM

## 2020-02-06 DIAGNOSIS — F5104 Psychophysiologic insomnia: Secondary | ICD-10-CM

## 2020-02-06 DIAGNOSIS — F325 Major depressive disorder, single episode, in full remission: Secondary | ICD-10-CM | POA: Diagnosis not present

## 2020-02-06 MED ORDER — ESCITALOPRAM OXALATE 10 MG PO TABS: 10.0000 mg | ORAL_TABLET | Freq: Every day | ORAL | 3 refills | Status: DC

## 2020-02-06 MED ORDER — ATORVASTATIN CALCIUM 20 MG PO TABS: 20.0000 mg | ORAL_TABLET | Freq: Every day | ORAL | 3 refills | Status: DC

## 2020-02-06 MED ORDER — AMLODIPINE BESYLATE 5 MG PO TABS: 5.0000 mg | ORAL_TABLET | Freq: Every day | ORAL | 3 refills | Status: DC

## 2020-02-06 NOTE — Patient Instructions (Addendum)
I'm glad the medicine is working out!  Great to see you today!  Return for wellness visit next spring.

## 2020-02-06 NOTE — Assessment & Plan Note (Addendum)
Nighttime urination 1-2 times overnight. With some urgency. Not bothersome currently. She will reach out of persisting and plan for urology referral.

## 2020-02-06 NOTE — Assessment & Plan Note (Signed)
LDL at goal. Continue atorvastatin.  

## 2020-02-06 NOTE — Assessment & Plan Note (Signed)
Improved with treatment of depression. Cont prn hydroxyzine and reducing as able

## 2020-02-06 NOTE — Progress Notes (Signed)
Subjective:     Stephanie Mullen is a 64 y.o. female presenting for Follow-up (6 week)     HPI  #depression - taking lexapro - has noticed significant improvement  #insomnia - still taking hydroxyzine - mostly on the weekends - does make her groggy in the morning - endorses some nocturia - tries to go to the bathroom before bed - stops drinking water 2 hours before bed - no caffeine  - no alcohol - rare 1-2 times per year  #HTN - does not check at home - no cp, sob, headache  Review of Systems  12/11/2019: Clinic - Depression - start lexapro. Encouraged therapy. Hydroxyzine for insomnia  Social History   Tobacco Use  Smoking Status Never Smoker  Smokeless Tobacco Never Used        Objective:    BP Readings from Last 3 Encounters:  02/06/20 140/78  12/11/19 136/80  09/07/19 132/84   Wt Readings from Last 3 Encounters:  02/06/20 226 lb 8 oz (102.7 kg)  12/11/19 224 lb (101.6 kg)  09/07/19 224 lb 8 oz (101.8 kg)    BP 140/78   Pulse 78   Temp 97.7 F (36.5 C) (Temporal)   Ht 5\' 1"  (1.549 m)   Wt 226 lb 8 oz (102.7 kg)   SpO2 97%   BMI 42.80 kg/m    Physical Exam Constitutional:      General: She is not in acute distress.    Appearance: She is well-developed. She is not diaphoretic.  HENT:     Right Ear: External ear normal.     Left Ear: External ear normal.     Nose: Nose normal.  Eyes:     Conjunctiva/sclera: Conjunctivae normal.  Cardiovascular:     Rate and Rhythm: Normal rate and regular rhythm.     Heart sounds: No murmur heard.   Pulmonary:     Effort: Pulmonary effort is normal. No respiratory distress.     Breath sounds: Normal breath sounds. No wheezing.  Musculoskeletal:     Cervical back: Neck supple.  Skin:    General: Skin is warm and dry.     Capillary Refill: Capillary refill takes less than 2 seconds.  Neurological:     Mental Status: She is alert. Mental status is at baseline.  Psychiatric:        Mood and  Affect: Mood normal.        Behavior: Behavior normal.    Depression screen [facility] 2/9 02/06/2020 12/11/2019 09/07/2019 11/10/2018  Decreased Interest 0 3 0 0  Down, Depressed, Hopeless 0 2 0 0  PHQ - 2 Score 0 5 0 0  Altered sleeping - 3 - 0  Tired, decreased energy - 3 - 1  Change in appetite - 3 - 1  Feeling bad or failure about yourself  - 3 - 0  Trouble concentrating - 2 - 0  Moving slowly or fidgety/restless - 1 - 0  Suicidal thoughts - 0 - 0  PHQ-9 Score - 20 - 2  Difficult doing work/chores Not difficult at all Very difficult - Not difficult at all    Lab Results  Component Value Date   CHOL 110 09/07/2019   HDL 44.00 09/07/2019   LDLCALC 42 09/07/2019   TRIG 124.0 09/07/2019   CHOLHDL 3 09/07/2019         Assessment & Plan:   Problem List Items Addressed This Visit      Cardiovascular and Mediastinum   CAD  in native artery    Lipids at goal. Discussed life-long statin therapy      Relevant Medications   amLODipine (NORVASC) 5 MG tablet   atorvastatin (LIPITOR) 20 MG tablet   Essential hypertension    Borderline bp today. Will continue to monitor      Relevant Medications   amLODipine (NORVASC) 5 MG tablet   atorvastatin (LIPITOR) 20 MG tablet     Other   Hyperlipidemia    LDL at goal. Continue atorvastatin      Relevant Medications   amLODipine (NORVASC) 5 MG tablet   atorvastatin (LIPITOR) 20 MG tablet   Depression, major, single episode, complete remission (HCC) - Primary    Remission. Discussed cont lexapro x 1 year or longer if desired.       Relevant Medications   escitalopram (LEXAPRO) 10 MG tablet   Psychophysiological insomnia    Improved with treatment of depression. Cont prn hydroxyzine and reducing as able      Nocturia    Nighttime urination 1-2 times overnight. With some urgency. Not bothersome currently. She will reach out of persisting and plan for urology referral.           Return in about 1 year (around 02/05/2021) for  depression follow-up or sooner.  Lynnda Child, MD  This visit occurred during the SARS-CoV-2 public health emergency.  Safety protocols were in place, including screening questions prior to the visit, additional usage of staff PPE, and extensive cleaning of exam room while observing appropriate contact time as indicated for disinfecting solutions.

## 2020-02-06 NOTE — Assessment & Plan Note (Signed)
Remission. Discussed cont lexapro x 1 year or longer if desired.

## 2020-02-06 NOTE — Assessment & Plan Note (Signed)
Lipids at goal. Discussed life-long statin therapy

## 2020-02-06 NOTE — Assessment & Plan Note (Signed)
Borderline bp today. Will continue to monitor

## 2020-03-12 ENCOUNTER — Ambulatory Visit: Payer: Medicare Other | Admitting: Family Medicine

## 2020-08-13 ENCOUNTER — Other Ambulatory Visit: Payer: Self-pay

## 2020-08-14 ENCOUNTER — Ambulatory Visit (INDEPENDENT_AMBULATORY_CARE_PROVIDER_SITE_OTHER): Payer: Medicare Other | Admitting: Family Medicine

## 2020-08-14 ENCOUNTER — Encounter: Payer: Self-pay | Admitting: Family Medicine

## 2020-08-14 VITALS — BP 128/72 | HR 77 | Temp 97.3°F | Ht 61.0 in | Wt 190.5 lb

## 2020-08-14 DIAGNOSIS — M542 Cervicalgia: Secondary | ICD-10-CM | POA: Diagnosis not present

## 2020-08-14 DIAGNOSIS — G8929 Other chronic pain: Secondary | ICD-10-CM | POA: Insufficient documentation

## 2020-08-14 DIAGNOSIS — M7502 Adhesive capsulitis of left shoulder: Secondary | ICD-10-CM | POA: Insufficient documentation

## 2020-08-14 MED ORDER — TRIAMCINOLONE ACETONIDE 40 MG/ML IJ SUSP
40.0000 mg | Freq: Once | INTRAMUSCULAR | Status: AC
  Administered 2020-08-14: 40 mg via INTRA_ARTICULAR

## 2020-08-14 NOTE — Patient Instructions (Signed)
Neck pain - Work on gentle neck stretches to support  Frozen shoulder - Home exercises - May consider PT if no significant improvement

## 2020-08-14 NOTE — Progress Notes (Signed)
Subjective:     Stephanie Mullen is a 65 y.o. female presenting for Shoulder Pain (L ) and Neck Pain (Left side x 5 months )     Shoulder Pain  The pain is present in the left shoulder and left arm. This is a new problem. The current episode started more than 1 month ago. The quality of the pain is described as sharp and aching. The pain is moderate. Associated symptoms include a limited range of motion, numbness and tingling. Pertinent negatives include no fever. She has tried NSAIDS for the symptoms. The treatment provided mild relief.  Neck Pain  This is a new problem. The current episode started more than 1 month ago. The pain is associated with a sleep position. The pain is present in the left side (behind the ear). Quality: pinching. The pain is moderate. The symptoms are aggravated by position (raising the arm to 90). Associated symptoms include numbness and tingling. Pertinent negatives include no fever, headaches or weakness. She has tried NSAIDs (icy/hot) for the symptoms. The treatment provided moderate relief.   Was sleeping on an incline for GERD Started having neck pain and switched to flat   Review of Systems  Constitutional: Negative for fever.  Musculoskeletal: Positive for neck pain.  Neurological: Positive for tingling and numbness. Negative for weakness and headaches.     Social History   Tobacco Use  Smoking Status Never Smoker  Smokeless Tobacco Never Used        Objective:    BP Readings from Last 3 Encounters:  08/14/20 128/72  02/06/20 140/78  12/11/19 136/80   Wt Readings from Last 3 Encounters:  08/14/20 190 lb 8 oz (86.4 kg)  02/06/20 226 lb 8 oz (102.7 kg)  12/11/19 224 lb (101.6 kg)    BP 128/72   Pulse 77   Temp (!) 97.3 F (36.3 C) (Temporal)   Ht 5\' 1"  (1.549 m)   Wt 190 lb 8 oz (86.4 kg)   SpO2 98%   BMI 35.99 kg/m    Physical Exam Constitutional:      General: She is not in acute distress.    Appearance: She is  well-developed. She is not diaphoretic.  HENT:     Right Ear: External ear normal.     Left Ear: External ear normal.  Eyes:     Conjunctiva/sclera: Conjunctivae normal.  Cardiovascular:     Rate and Rhythm: Normal rate.  Pulmonary:     Effort: Pulmonary effort is normal.  Musculoskeletal:     Cervical back: Normal range of motion and neck supple. No edema or rigidity. Pain with movement (tightness on the left side with right rotation) present.     Comments: Left Shoulder:  Inspection: no step off Palpation: TTP along the anterior and posterior lateral shoulder ROM: unable to lift past 100 degrees with flexion and abduction.  Strength: normal   Skin:    General: Skin is warm and dry.     Capillary Refill: Capillary refill takes less than 2 seconds.  Neurological:     Mental Status: She is alert. Mental status is at baseline.  Psychiatric:        Mood and Affect: Mood normal.        Behavior: Behavior normal.           Assessment & Plan:   Problem List Items Addressed This Visit      Musculoskeletal and Integument   Adhesive capsulitis of left shoulder - Primary  Home exercise routine. Joint injection as below. Advised PT if ROM not improving over the next few weeks. Discussed it could take 1 year for symptoms to fully resolve.         Other   Chronic neck pain    Mild symptoms compared to shoulder. Cont prn NSAIDs. Advised gentle stretching to help.         Intraarticular Shoulder Aspiration/Injection Procedure Note Stephanie Mullen [birth year]  Procedure: Large Joint Aspiration / Injection of Shoulder, Intraarticular Indications: Pain  Procedure Details Verbal consent was obtained from the patient. Risks explained and contrasted with benefits and alternatives. Patient prepped with Chloraprep and Ethyl Chloride used for anesthesia. An intraarticular shoulder injection was performed using the posterior approach; needle placed into joint capsule without  difficulty. The patient tolerated the procedure well and had decreased pain post injection. No complications.  Injection: 9 cc of Lidocaine 1% and 1 mL Kenalog 40 mg. Needle: 21 gauge, 2 inch   Return if symptoms worsen or fail to improve.  Lynnda Child, MD  This visit occurred during the SARS-CoV-2 public health emergency.  Safety protocols were in place, including screening questions prior to the visit, additional usage of staff PPE, and extensive cleaning of exam room while observing appropriate contact time as indicated for disinfecting solutions.

## 2020-08-14 NOTE — Assessment & Plan Note (Signed)
Home exercise routine. Joint injection as below. Advised PT if ROM not improving over the next few weeks. Discussed it could take 1 year for symptoms to fully resolve.

## 2020-08-14 NOTE — Assessment & Plan Note (Signed)
Mild symptoms compared to shoulder. Cont prn NSAIDs. Advised gentle stretching to help.

## 2021-03-04 ENCOUNTER — Telehealth: Payer: Self-pay | Admitting: Family Medicine

## 2021-03-04 NOTE — Telephone Encounter (Signed)
LVM for pt to rtn my call to schedule AWV with [facility].  

## 2021-03-14 ENCOUNTER — Other Ambulatory Visit: Payer: Self-pay | Admitting: Family Medicine

## 2021-03-14 DIAGNOSIS — F331 Major depressive disorder, recurrent, moderate: Secondary | ICD-10-CM

## 2021-03-14 DIAGNOSIS — I10 Essential (primary) hypertension: Secondary | ICD-10-CM

## 2021-04-23 ENCOUNTER — Ambulatory Visit (INDEPENDENT_AMBULATORY_CARE_PROVIDER_SITE_OTHER): Payer: Medicare Other | Admitting: Family Medicine

## 2021-04-23 ENCOUNTER — Other Ambulatory Visit: Payer: Self-pay

## 2021-04-23 VITALS — BP 128/90 | HR 65 | Temp 97.0°F | Ht 61.0 in | Wt 200.0 lb

## 2021-04-23 DIAGNOSIS — Z23 Encounter for immunization: Secondary | ICD-10-CM | POA: Diagnosis not present

## 2021-04-23 DIAGNOSIS — E782 Mixed hyperlipidemia: Secondary | ICD-10-CM

## 2021-04-23 DIAGNOSIS — I10 Essential (primary) hypertension: Secondary | ICD-10-CM

## 2021-04-23 DIAGNOSIS — F325 Major depressive disorder, single episode, in full remission: Secondary | ICD-10-CM

## 2021-04-23 DIAGNOSIS — F331 Major depressive disorder, recurrent, moderate: Secondary | ICD-10-CM

## 2021-04-23 DIAGNOSIS — R7303 Prediabetes: Secondary | ICD-10-CM

## 2021-04-23 DIAGNOSIS — Z0000 Encounter for general adult medical examination without abnormal findings: Secondary | ICD-10-CM | POA: Diagnosis not present

## 2021-04-23 DIAGNOSIS — Z1211 Encounter for screening for malignant neoplasm of colon: Secondary | ICD-10-CM

## 2021-04-23 DIAGNOSIS — Z1231 Encounter for screening mammogram for malignant neoplasm of breast: Secondary | ICD-10-CM | POA: Diagnosis not present

## 2021-04-23 LAB — COMPREHENSIVE METABOLIC PANEL
ALT: 13 U/L (ref 0–35)
AST: 17 U/L (ref 0–37)
Albumin: 4.3 g/dL (ref 3.5–5.2)
Alkaline Phosphatase: 85 U/L (ref 39–117)
BUN: 17 mg/dL (ref 6–23)
CO2: 30 mEq/L (ref 19–32)
Calcium: 9.6 mg/dL (ref 8.4–10.5)
Chloride: 105 mEq/L (ref 96–112)
Creatinine, Ser: 0.8 mg/dL (ref 0.40–1.20)
GFR: 76.52 mL/min (ref >60.00)
Glucose, Bld: 94 mg/dL (ref 70–99)
Potassium: 4.7 mEq/L (ref 3.5–5.1)
Sodium: 140 mEq/L (ref 135–145)
Total Bilirubin: 0.7 mg/dL (ref 0.2–1.2)
Total Protein: 7.1 g/dL (ref 6.0–8.3)

## 2021-04-23 LAB — LIPID PANEL
Cholesterol: 188 mg/dL (ref 0–200)
HDL: 63.9 mg/dL (ref >39.00)
LDL Cholesterol: 102 mg/dL — ABNORMAL HIGH (ref 0–99)
NonHDL: 123.73
Total CHOL/HDL Ratio: 3
Triglycerides: 109 mg/dL (ref 0.0–149.0)
VLDL: 21.8 mg/dL (ref 0.0–40.0)

## 2021-04-23 LAB — HEMOGLOBIN A1C: Hgb A1c MFr Bld: 5.8 % (ref 4.6–6.5)

## 2021-04-23 MED ORDER — ESCITALOPRAM OXALATE 10 MG PO TABS: 10.0000 mg | ORAL_TABLET | Freq: Every day | ORAL | 1 refills | Status: DC

## 2021-04-23 NOTE — Assessment & Plan Note (Signed)
C/b depression, HTN, HLD. Working on [employer] and regular exercise. Depression improving

## 2021-04-23 NOTE — Assessment & Plan Note (Signed)
Doing well. Discussed taper to stop lexapro today. Follow-up if side effects or difficulty

## 2021-04-23 NOTE — Progress Notes (Signed)
Subjective:   Stephanie Mullen is a 65 y.o. female who presents for Medicare Annual (Subsequent) preventive examination.  Review of Systems    Review of Systems  Constitutional:  Negative for chills and fever.  HENT:  Negative for congestion and sore throat.   Eyes:  Negative for blurred vision and double vision.  Respiratory:  Negative for shortness of breath.   Cardiovascular:  Negative for chest pain.  Gastrointestinal:  Negative for heartburn, nausea and vomiting.  Genitourinary: Negative.   Musculoskeletal: Negative.  Negative for myalgias.  Skin:  Negative for rash.  Neurological:  Negative for dizziness and headaches.  Endo/Heme/Allergies:  Does not bruise/bleed easily.  Psychiatric/Behavioral:  Negative for depression. The patient is not nervous/anxious.    Cardiac Risk Factors include: advanced age (>41men, >41 women);dyslipidemia;obesity (BMI >30kg/m2)     Objective:    Today's Vitals   04/23/21 0844  BP: 128/90  Pulse: 65  Temp: (!) 97 F (36.1 C)  TempSrc: Temporal  SpO2: 98%  Weight: 200 lb (90.7 kg)  Height: 5\' 1"  (1.549 m)   Body mass index is 37.79 kg/m.  Advanced Directives 04/23/2021 09/07/2019  Does Patient Have a Medical Advance Directive? No No  Would patient like information on creating a medical advance directive? Yes (MAU/Ambulatory/Procedural Areas - Information given) Yes (MAU/Ambulatory/Procedural Areas - Information given)    Current Medications (verified) Outpatient Encounter Medications as of 04/23/2021  Medication Sig   acetaminophen (TYLENOL) 325 MG tablet Take 650 mg by mouth every 6 (six) hours as needed.   amLODipine (NORVASC) 5 MG tablet Take 1 tablet (5 mg total) by mouth daily. NEEDS OFFICE VISIT   [DISCONTINUED] escitalopram (LEXAPRO) 10 MG tablet Take 1 tablet (10 mg total) by mouth daily. NEEDS OFFICE VISIT   escitalopram (LEXAPRO) 10 MG tablet Take 1 tablet (10 mg total) by mouth daily.   esomeprazole (NEXIUM) 40 MG  capsule Take 1 capsule by mouth daily in the morning (Patient not taking: Reported on 04/23/2021)   [DISCONTINUED] atorvastatin (LIPITOR) 20 MG tablet Take 1 tablet (20 mg total) by mouth daily at 6 PM.   No facility-administered encounter medications on file as of 04/23/2021.    Allergies (verified) Celebrex [celecoxib]   History: Past Medical History:  Diagnosis Date   Arthritis    Asthma    Coronary artery disease    GERD (gastroesophageal reflux disease)    Hypertension    Past Surgical History:  Procedure Laterality Date   BACK SURGERY     CARDIAC CATHETERIZATION  2015   TONSILLECTOMY     Family History  Problem Relation Age of Onset   Asthma Mother    Diabetes Mother    Allergies Father    Heart disease Father        has several blockages   Cystic fibrosis Sister    Obesity Brother        morbid   Sleep apnea Brother    Rheum arthritis Paternal Grandfather    Social History   Socioeconomic History   Marital status: Married    Spouse name: [name]   Number of children: 2   Years of education: College   Highest education level: Not on file  Occupational History   Not on file  Tobacco Use   Smoking status: Never   Smokeless tobacco: Never  Vaping Use   Vaping Use: Never used  Substance and Sexual Activity   Alcohol use: Yes    Comment: 1-2 times a year, 1 drink  Drug use: Never   Sexual activity: Not Currently    Birth control/protection: Post-menopausal  Other Topics Concern   Not on file  Social History Narrative   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    Has 2 children - one son in [place] (does not see him often), daughter lives nearby (gets a long)   Grandchildren: 1 grandchild   Lives with husband, [name]   Enjoys: arts and crafts, hiking (though limited by health)   Exercise: walking to the stop sign now   Diet: thinks she eats too much bread, trying to reduce sugar    Social Determinants of Health   Financial Resource Strain: Not on file  Food Insecurity: Not on file  Transportation Needs: Not on file  Physical Activity: Not on file  Stress: Not on file  Social Connections: Not on file    Tobacco Counseling Counseling given: Not Answered   Clinical Intake:  Pre-visit preparation completed: No  Pain : No/denies pain     BMI - recorded: 37.79 Nutritional Status: BMI > 30  Obese Nutritional Risks: None Diabetes: No  How often do you need to have someone help you when you read instructions, pamphlets, or other written materials from your doctor or pharmacy?: 1 - Never What is the last grade level you completed in school?: college  Diabetic?no  Interpreter Needed?: No      Activities of Daily Living In your present state of health, do you have any difficulty performing the following activities: 04/23/2021  Hearing? N  Vision? N  Difficulty concentrating or making decisions? N  Walking or climbing stairs? N  Dressing or bathing? N  Doing errands, shopping? N  Preparing Food and eating ? N  Using the Toilet? N  In the past six months, have you accidently leaked urine? Y  Do you have problems with loss of bowel control? N  Managing your Medications? N  Managing your Finances? N  Housekeeping or managing your Housekeeping? N  Some recent data might be hidden    Patient Care Team: Lynnda Child, MD as PCP - General (Family Medicine)  Indicate any recent Medical Services you may have received from other than [facility] providers in the past year (date may be approximate).     Assessment:   This is a routine wellness examination for Pearline.  Hearing/Vision screen Hearing Screening   250Hz  500Hz  1000Hz  2000Hz  4000Hz   Right ear 20 20 20 20 20   Left ear 20 20 20 20 20    Vision Screening   Right eye Left eye Both eyes  Without correction     With correction 20/20 20/30 20/15     Dietary issues and exercise activities  discussed: Current Exercise Habits: Home exercise routine;Structured exercise class, Type of exercise: walking;calisthenics;Other - see comments (pool), Time (Minutes): 20, Frequency (Times/Week): 7, Weekly Exercise (Minutes/Week): 140, Intensity: Moderate, Exercise limited by: None identified   Goals Addressed             This Visit's Progress    Exercise 3x per week (30 min per time)       Continue to get out and be more active - bike ride on electric bike      Depression Screen PHQ 2/9 Scores 04/23/2021 04/23/2021 02/06/2020 12/11/2019 09/07/2019 11/10/2018  PHQ - 2 Score 0 0 0 5 0 0  PHQ- 9 Score 1 - - 20 - 2  Fall Risk Fall Risk  04/23/2021 09/07/2019 09/07/2019  Falls in the past year? 0 0 0  Number falls in past yr: 0 0 0  Injury with Fall? - 0 0    FALL RISK PREVENTION PERTAINING TO THE HOME:  Any stairs in or around the home? No  If so, are there any without handrails? Yes  Home free of loose throw rugs in walkways, pet beds, electrical cords, etc? Yes  Adequate lighting in your home to reduce risk of falls? Yes   ASSISTIVE DEVICES UTILIZED TO PREVENT FALLS:  Life alert? No  Use of a cane, walker or w/c? No  Grab bars in the bathroom? Yes  Shower chair or bench in shower? No  Elevated toilet seat or a handicapped toilet? No   Cognitive Function:     6CIT Screen 09/07/2019  What Year? 0 points  What month? 0 points  What time? 0 points  Count back from 20 0 points  Months in reverse 0 points  Repeat phrase 0 points  Total Score 0    Mini-Cog - 04/23/21 0901     Normal clock drawing test? yes    How many words correct? 3             Mini-Cog - 04/23/21 0901     Normal clock drawing test? yes    How many words correct? 3               Immunizations Immunization History  Administered Date(s) Administered   Fluad Quad(high Dose 65+) 05/31/2020, 04/23/2021   Influenza, Seasonal, Injecte, Preservative Fre 04/03/2019   PFIZER(Purple  Top)SARS-COV-2 Vaccination 08/11/2019, 09/05/2019, 03/30/2020   Pneumococcal Conjugate-13 12/11/2019   Tdap 02/23/2017    TDAP status: Up to date  Flu Vaccine status: Completed at today's visit  Pneumococcal vaccine status: Due, Education has been provided regarding the importance of this vaccine. Advised may receive this vaccine at local pharmacy or Health Dept. Aware to provide a copy of the vaccination record if obtained from local pharmacy or Health Dept. Verbalized acceptance and understanding.  Covid-19 vaccine status: Completed vaccines  Qualifies for Shingles Vaccine? No   Zostavax completed No   Shingles - she has completed in 2019 - [facility] [place]  Screening Tests Health Maintenance  Topic Date Due   Zoster Vaccines- Shingrix (1 of 2) Never done   MAMMOGRAM  11/06/2019   COVID-19 Vaccine (4 - Booster for [employer] series) 05/25/2020   COLON CANCER SCREENING ANNUAL FOBT  09/10/2020   Pneumonia Vaccine 49+ Years old (2 - PPSV23 if available, else PCV20) 12/10/2020   TETANUS/TDAP  02/24/2027   INFLUENZA VACCINE  Completed   DEXA SCAN  Completed   Hepatitis C Screening  Completed   HPV VACCINES  Aged Out   COLONOSCOPY (Pts 45-44yrs Insurance coverage will need to be confirmed)  Discontinued    Health Maintenance  Health Maintenance Due  Topic Date Due   Zoster Vaccines- Shingrix (1 of 2) Never done   MAMMOGRAM  11/06/2019   COVID-19 Vaccine (4 - Booster for [employer] series) 05/25/2020   COLON CANCER SCREENING ANNUAL FOBT  09/10/2020   Pneumonia Vaccine 46+ Years old (2 - PPSV23 if available, else PCV20) 12/10/2020    Colorectal cancer screening: Type of screening: FOBT/FIT. Completed 2021. Repeat every 1 years  Mammogram status: Completed 2019. Repeat every year  Bone Density status: Completed 2021. Results reflect: Bone density results: OSTEOPENIA. Repeat every 3-5 years.  Social History   Tobacco Use  Smoking Status Never  Smokeless Tobacco Never     Lung  Cancer Screening: (Low Dose CT Chest recommended if Age 27-80 years, 30 pack-year currently smoking OR have quit w/in 15years.) does not qualify.   Lung Cancer Screening Referral: n/a  Additional Screening:  Lab Results  Component Value Date   HEPCAB NON-REACTIVE 09/07/2019     Hepatitis C Screening: does qualify; Completed 2021  Vision Screening: Recommended annual ophthalmology exams for early detection of glaucoma and other disorders of the eye. Is the patient up to date with their annual eye exam?  No  Who is the provider or what is the name of the office in which the patient attends annual eye exams? [facility] If pt is not established with a provider, would they like to be referred to a provider to establish care? No .   Dental Screening: Recommended annual dental exams for proper oral hygiene  Community Resource Referral / Chronic Care Management: CRR required this visit?  No   CCM required this visit?  No      Plan:      Problem List Items Addressed This Visit       Cardiovascular and Mediastinum   Essential hypertension   Relevant Orders   Comprehensive metabolic panel     Other   Morbid obesity (HCC)    C/b depression, HTN, HLD. Working on [employer] and regular exercise. Depression improving      Hyperlipidemia   Relevant Orders   Lipid panel   Prediabetes   Relevant Orders   Hemoglobin A1c   Depression, major, single episode, complete remission (HCC)    Doing well. Discussed taper to stop lexapro today. Follow-up if side effects or difficulty      Relevant Medications   escitalopram (LEXAPRO) 10 MG tablet   Other Visit Diagnoses     Encounter for Medicare annual wellness exam    -  Primary   Relevant Orders   Comprehensive metabolic panel   Lipid panel   Hemoglobin A1c   Moderate episode of recurrent major depressive disorder (HCC)       Relevant Medications   escitalopram (LEXAPRO) 10 MG tablet   Need for influenza vaccination        Relevant Orders   Flu Vaccine QUAD High Dose(Fluad) (Completed)   Encounter for screening mammogram for malignant neoplasm of breast       Relevant Orders   MM Digital Screening   Colon cancer screening       Relevant Orders   Fecal occult blood, imunochemical       I have personally reviewed and noted the following in the patient's chart:   Medical and social history Use of alcohol, tobacco or illicit drugs  Current medications and supplements including opioid prescriptions.  Functional ability and status Nutritional status Physical activity Advanced directives List of other physicians Hospitalizations, surgeries, and ER visits in previous 12 months Vitals Screenings to include cognitive, depression, and falls Referrals and appointments  In addition, I have reviewed and discussed with patient certain preventive protocols, quality metrics, and best practice recommendations. A written personalized care plan for preventive services as well as general preventive health recommendations were provided to patient.     Lynnda Child, MD   04/23/2021

## 2021-04-23 NOTE — Patient Instructions (Addendum)
Return for pneumonia vaccine  You can call for a mammogram at these locations:  [facility] at [facility].  [address] [place] [phone]   Lexapro - try taking 1/2 tablet daily (or every other day) - decrease dose as tolerated based on lack of symptoms of withdrawal

## 2021-04-25 ENCOUNTER — Other Ambulatory Visit (INDEPENDENT_AMBULATORY_CARE_PROVIDER_SITE_OTHER): Payer: Medicare Other

## 2021-04-25 DIAGNOSIS — Z1211 Encounter for screening for malignant neoplasm of colon: Secondary | ICD-10-CM

## 2021-04-25 LAB — FECAL OCCULT BLOOD, IMMUNOCHEMICAL: Fecal Occult Bld: NEGATIVE

## 2021-07-22 ENCOUNTER — Other Ambulatory Visit: Payer: Self-pay | Admitting: Family Medicine

## 2021-07-22 DIAGNOSIS — I10 Essential (primary) hypertension: Secondary | ICD-10-CM

## 2021-07-24 ENCOUNTER — Other Ambulatory Visit: Payer: Self-pay

## 2021-07-24 ENCOUNTER — Encounter: Payer: Self-pay | Admitting: Family

## 2021-07-24 ENCOUNTER — Ambulatory Visit (INDEPENDENT_AMBULATORY_CARE_PROVIDER_SITE_OTHER): Payer: Medicare Other | Admitting: Family

## 2021-07-24 VITALS — BP 172/94 | HR 76 | Temp 97.5°F | Ht 61.0 in | Wt 211.0 lb

## 2021-07-24 DIAGNOSIS — J309 Allergic rhinitis, unspecified: Secondary | ICD-10-CM

## 2021-07-24 DIAGNOSIS — J0110 Acute frontal sinusitis, unspecified: Secondary | ICD-10-CM | POA: Diagnosis not present

## 2021-07-24 MED ORDER — AMOXICILLIN-POT CLAVULANATE 875-125 MG PO TABS: 1.0000 | ORAL_TABLET | Freq: Two times a day (BID) | ORAL | 0 refills | Status: AC

## 2021-07-24 NOTE — Patient Instructions (Signed)
Antibiotic sent to preferred pharmacy. Please start and take as directed. Ok to continue with mucinex and iubprofen as needed.   Please increase oral fluids, steamy hot shower/humidifier prn.  Please follow up if no improvement in 2-3 days.   It was a pleasure seeing you today! Please do not hesitate to reach out with any questions and or concerns.  Regards,   Mort Sawyers

## 2021-07-24 NOTE — Assessment & Plan Note (Signed)
Continue nasocort daily.

## 2021-07-24 NOTE — Progress Notes (Signed)
Established Patient Office Visit  Subjective:  Patient ID: Stephanie Mullen, female    DOB: 1956/04/10  Age: 66 y.o. MRN: [id number]  CC:  Chief Complaint  Patient presents with   Cough   Sinusitis    HPI SHERIDYN CANINO is here today with concerns.   9 day h/o of sinus pressure, nasal congestion, tooth pain on the right side of sinus, bil ear no pain, and sore throat which is resolving. No fever and or chills. Post nasal drip, wet productive cough with green/yellow coloring. No chest congestion. No wheezing and no sob.   Initially tried zicam nasal , no relief. Progressed to dayquil cold and flu x 4 days, some mild relief of tooth pain but then didn't really help. Last two days tried mucinex with some mild relief. Takes daily nasocort.   Past Medical History:  Diagnosis Date   Arthritis    Asthma    Coronary artery disease    GERD (gastroesophageal reflux disease)    Hypertension     Past Surgical History:  Procedure Laterality Date   BACK SURGERY     CARDIAC CATHETERIZATION  2015   TONSILLECTOMY      Family History  Problem Relation Age of Onset   Asthma Mother    Diabetes Mother    Allergies Father    Heart disease Father        has several blockages   Cystic fibrosis Sister    Obesity Brother        morbid   Sleep apnea Brother    Rheum arthritis Paternal Grandfather     Social History   Socioeconomic History   Marital status: Married    Spouse name: [name]   Number of children: 2   Years of education: College   Highest education level: Not on file  Occupational History   Not on file  Tobacco Use   Smoking status: Never   Smokeless tobacco: Never  Vaping Use   Vaping Use: Never used  Substance and Sexual Activity   Alcohol use: Yes    Comment: 1-2 times a year, 1 drink   Drug use: Never   Sexual activity: Not Currently    Birth control/protection: Post-menopausal  Other Topics Concern   Not on file  Social History Narrative   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    Has 2 children - one son in [place] (does not see him often), daughter lives nearby (gets a long)   Grandchildren: 1 grandchild   Lives with husband, [name]   Enjoys: arts and crafts, hiking (though limited by health)   Exercise: walking to the stop sign now   Diet: thinks she eats too much bread, trying to reduce sugar   Social Determinants of Health   Financial Resource Strain: Not on file  Food Insecurity: Not on file  Transportation Needs: Not on file  Physical Activity: Not on file  Stress: Not on file  Social Connections: Not on file  Intimate Partner Violence: Not on file    Outpatient Medications Prior to Visit  Medication Sig Dispense Refill   acetaminophen (TYLENOL) 325 MG tablet Take 650 mg by mouth every 6 (six) hours as needed.     amLODipine (NORVASC) 5 MG tablet TAKE 1 TABLET BY MOUTH ONCE DAILY . APPOINTMENT REQUIRED FOR FUTURE REFILLS 90 tablet 3   esomeprazole (NEXIUM) 40 MG capsule Take 1  capsule by mouth daily in the morning 30 capsule 5   escitalopram (LEXAPRO) 10 MG tablet Take 1 tablet (10 mg total) by mouth daily. 90 tablet 1   No facility-administered medications prior to visit.    Allergies  Allergen Reactions   Celebrex [Celecoxib] Rash    ROS Review of Systems  Constitutional:  Negative for chills and fever.  HENT:  Positive for congestion, postnasal drip, sinus pressure, sinus pain and sore throat. Negative for ear pain.   Respiratory:  Negative for cough, shortness of breath and wheezing.   Cardiovascular:  Negative for chest pain and palpitations.     Objective:    Physical Exam Constitutional:      General: She is not in acute distress.    Appearance: Normal appearance. She is obese. She is not ill-appearing, toxic-appearing or diaphoretic.  HENT:     Head: Normocephalic.     Right Ear: Tympanic membrane normal.     Left Ear:  Tympanic membrane normal.     Nose: Nose normal.     Mouth/Throat:     Mouth: Mucous membranes are moist.  Eyes:     Pupils: Pupils are equal, round, and reactive to light.  Cardiovascular:     Rate and Rhythm: Normal rate and regular rhythm.  Pulmonary:     Effort: Pulmonary effort is normal.     Breath sounds: Normal breath sounds.  Neurological:     Mental Status: She is alert.    BP (!) 172/94    Pulse 76    Temp (!) 97.5 F (36.4 C) (Temporal)    Ht 5\' 1"  (1.549 m)    Wt 211 lb (95.7 kg)    SpO2 97%    BMI 39.87 kg/m  Wt Readings from Last 3 Encounters:  07/24/21 211 lb (95.7 kg)  04/23/21 200 lb (90.7 kg)  08/14/20 190 lb 8 oz (86.4 kg)     Health Maintenance Due  Topic Date Due   MAMMOGRAM  11/06/2019   COVID-19 Vaccine (4 - Booster for Pfizer series) 05/25/2020   Pneumonia Vaccine 76+ Years old (2 - PPSV23 if available, else PCV20) 12/10/2020    There are no preventive care reminders to display for this patient.  No results found for: TSH Lab Results  Component Value Date   WBC 8.3 09/07/2019   HGB 12.9 09/07/2019   HCT 38.7 09/07/2019   MCV 86.2 09/07/2019   PLT 199.0 09/07/2019   Lab Results  Component Value Date   NA 140 04/23/2021   K 4.7 04/23/2021   CO2 30 04/23/2021   GLUCOSE 94 04/23/2021   BUN 17 04/23/2021   CREATININE 0.80 04/23/2021   BILITOT 0.7 04/23/2021   ALKPHOS 85 04/23/2021   AST 17 04/23/2021   ALT 13 04/23/2021   PROT 7.1 04/23/2021   ALBUMIN 4.3 04/23/2021   CALCIUM 9.6 04/23/2021   GFR 76.52 04/23/2021   Lab Results  Component Value Date   HGBA1C 5.8 04/23/2021      Assessment & Plan:   Problem List Items Addressed This Visit       Respiratory   Allergic rhinitis    Continue nasocort daily.      Acute non-recurrent frontal sinusitis - Primary    Prescription given for augmentin 875/125 mg po bid for ten days. Pt to continue tylenol/ibuprofen prn sinus pain. Continue with humidifier prn and steam showers  recommended as well. instructed If no symptom improvement in 48 hours please f/u  Relevant Medications   amoxicillin-clavulanate (AUGMENTIN) 875-125 MG tablet    Meds ordered this encounter  Medications   amoxicillin-clavulanate (AUGMENTIN) 875-125 MG tablet    Sig: Take 1 tablet by mouth 2 (two) times daily for 10 days.    Dispense:  20 tablet    Refill:  0    Order Specific Question:   Supervising Provider    Answer:   BEDSOLE, AMY E [[id number]]    Follow-up: Return if symptoms worsen or fail to improve.    Mort Sawyersabitha Tyreonna Czaplicki, FNP

## 2021-07-24 NOTE — Assessment & Plan Note (Signed)
Prescription given for augmentin 875/125 mg po bid for ten days. Pt to continue tylenol/ibuprofen prn sinus pain. Continue with humidifier prn and steam showers recommended as well. instructed If no symptom improvement in 48 hours please f/u ? ?

## 2022-01-26 ENCOUNTER — Other Ambulatory Visit: Payer: Self-pay | Admitting: Family Medicine

## 2022-01-26 DIAGNOSIS — Z1231 Encounter for screening mammogram for malignant neoplasm of breast: Secondary | ICD-10-CM

## 2022-02-16 ENCOUNTER — Telehealth: Payer: Self-pay | Admitting: Family Medicine

## 2022-02-16 NOTE — Telephone Encounter (Signed)
Patient called in and said that she knows Dr Selena Batten is leaving in October and knows she would still be her PCP until then. She said her husband sees Dr Reece Agar and wanted to know if after Dr Selena Batten leaves if she could establish with him. Please advise, call back is [phone]

## 2022-02-16 NOTE — Telephone Encounter (Signed)
Patient scheduled.

## 2022-02-16 NOTE — Telephone Encounter (Signed)
Ok to schedule with me. Thank you.  

## 2022-02-20 ENCOUNTER — Ambulatory Visit
Admission: RE | Admit: 2022-02-20 | Discharge: 2022-02-20 | Disposition: A | Discharge status: Home / Self Care | Payer: Medicare Other | Source: Ambulatory Visit | Attending: Family Medicine | Admitting: Family Medicine

## 2022-02-20 DIAGNOSIS — Z1231 Encounter for screening mammogram for malignant neoplasm of breast: Secondary | ICD-10-CM | POA: Insufficient documentation

## 2022-04-23 ENCOUNTER — Ambulatory Visit: Payer: Medicare Other | Admitting: Family Medicine

## 2022-04-24 ENCOUNTER — Ambulatory Visit (INDEPENDENT_AMBULATORY_CARE_PROVIDER_SITE_OTHER): Payer: Medicare Other | Admitting: Family Medicine

## 2022-04-24 ENCOUNTER — Encounter: Payer: Self-pay | Admitting: Family Medicine

## 2022-04-24 VITALS — BP 142/78 | HR 83 | Temp 98.0°F | Ht 62.5 in | Wt 221.5 lb

## 2022-04-24 DIAGNOSIS — Z23 Encounter for immunization: Secondary | ICD-10-CM | POA: Diagnosis not present

## 2022-04-24 DIAGNOSIS — R0789 Other chest pain: Secondary | ICD-10-CM | POA: Diagnosis not present

## 2022-04-24 DIAGNOSIS — M85851 Other specified disorders of bone density and structure, right thigh: Secondary | ICD-10-CM

## 2022-04-24 DIAGNOSIS — Z1211 Encounter for screening for malignant neoplasm of colon: Secondary | ICD-10-CM | POA: Diagnosis not present

## 2022-04-24 DIAGNOSIS — Z7189 Other specified counseling: Secondary | ICD-10-CM | POA: Diagnosis not present

## 2022-04-24 DIAGNOSIS — Z0000 Encounter for general adult medical examination without abnormal findings: Secondary | ICD-10-CM

## 2022-04-24 DIAGNOSIS — I10 Essential (primary) hypertension: Secondary | ICD-10-CM

## 2022-04-24 DIAGNOSIS — E782 Mixed hyperlipidemia: Secondary | ICD-10-CM

## 2022-04-24 DIAGNOSIS — M519 Unspecified thoracic, thoracolumbar and lumbosacral intervertebral disc disorder: Secondary | ICD-10-CM

## 2022-04-24 DIAGNOSIS — I2510 Atherosclerotic heart disease of native coronary artery without angina pectoris: Secondary | ICD-10-CM | POA: Diagnosis not present

## 2022-04-24 DIAGNOSIS — K219 Gastro-esophageal reflux disease without esophagitis: Secondary | ICD-10-CM

## 2022-04-24 DIAGNOSIS — R7303 Prediabetes: Secondary | ICD-10-CM | POA: Diagnosis not present

## 2022-04-24 LAB — COMPREHENSIVE METABOLIC PANEL
ALT: 13 U/L (ref 0–35)
AST: 15 U/L (ref 0–37)
Albumin: 4.2 g/dL (ref 3.5–5.2)
Alkaline Phosphatase: 102 U/L (ref 39–117)
BUN: 19 mg/dL (ref 6–23)
CO2: 29 mEq/L (ref 19–32)
Calcium: 9.5 mg/dL (ref 8.4–10.5)
Chloride: 102 mEq/L (ref 96–112)
Creatinine, Ser: 0.81 mg/dL (ref 0.40–1.20)
GFR: 74.86 mL/min (ref >60.00)
Glucose, Bld: 89 mg/dL (ref 70–99)
Potassium: 3.9 mEq/L (ref 3.5–5.1)
Sodium: 138 mEq/L (ref 135–145)
Total Bilirubin: 0.9 mg/dL (ref 0.2–1.2)
Total Protein: 7 g/dL (ref 6.0–8.3)

## 2022-04-24 LAB — CBC WITH DIFFERENTIAL/PLATELET
Basophils Absolute: 0.1 10*3/uL (ref 0.0–0.1)
Basophils Relative: 0.5 % (ref 0.0–3.0)
Eosinophils Absolute: 0.2 10*3/uL (ref 0.0–0.7)
Eosinophils Relative: 2.4 % (ref 0.0–5.0)
HCT: 39.6 % (ref 36.0–46.0)
Hemoglobin: 12.9 g/dL (ref 12.0–15.0)
Lymphocytes Relative: 39.2 % (ref 12.0–46.0)
Lymphs Abs: 4 10*3/uL (ref 0.7–4.0)
MCHC: 32.5 g/dL (ref 30.0–36.0)
MCV: 86.5 fl (ref 78.0–100.0)
Monocytes Absolute: 0.6 10*3/uL (ref 0.1–1.0)
Monocytes Relative: 5.8 % (ref 3.0–12.0)
Neutro Abs: 5.3 10*3/uL (ref 1.4–7.7)
Neutrophils Relative %: 52.1 % (ref 43.0–77.0)
Platelets: 217 10*3/uL (ref 150.0–400.0)
RBC: 4.58 Mil/uL (ref 3.87–5.11)
RDW: 14 % (ref 11.5–15.5)
WBC: 10.1 10*3/uL (ref 4.0–10.5)

## 2022-04-24 LAB — MICROALBUMIN / CREATININE URINE RATIO
Creatinine,U: 105.1 mg/dL
Microalb Creat Ratio: 0.7 mg/g (ref 0.0–30.0)
Microalb, Ur: <0.7 mg/dL (ref 0.0–1.9)

## 2022-04-24 LAB — LIPID PANEL
Cholesterol: 173 mg/dL (ref 0–200)
HDL: 46.7 mg/dL (ref >39.00)
LDL Cholesterol: 103 mg/dL — ABNORMAL HIGH (ref 0–99)
NonHDL: 126.38
Total CHOL/HDL Ratio: 4
Triglycerides: 116 mg/dL (ref 0.0–149.0)
VLDL: 23.2 mg/dL (ref 0.0–40.0)

## 2022-04-24 LAB — TSH: TSH: 1.35 u[IU]/mL (ref 0.35–5.50)

## 2022-04-24 LAB — VITAMIN D 25 HYDROXY (VIT D DEFICIENCY, FRACTURES): VITD: 26.22 ng/mL — ABNORMAL LOW (ref 30.00–100.00)

## 2022-04-24 LAB — HEMOGLOBIN A1C: Hgb A1c MFr Bld: 6.2 % (ref 4.6–6.5)

## 2022-04-24 MED ORDER — ASPIRIN 81 MG PO TBEC: 81.0000 mg | DELAYED_RELEASE_TABLET | Freq: Every day | ORAL | Status: DC

## 2022-04-24 NOTE — Patient Instructions (Addendum)
Flu shot today Labs today  Pass by lab for stool kit.  We will refer you back to cardiology for evaluation.  Advanced directive packet provided today  We will request records of colonoscopy from [facility] clinic ~2012.  Return to see me in 3 months for follow up, sooner if needed.   Health Maintenance After Age 68 After age 65, you are at a higher risk for certain long-term diseases and infections as well as injuries from falls. Falls are a major cause of broken bones and head injuries in people who are older than age 59. Getting regular preventive care can help to keep you healthy and well. Preventive care includes getting regular testing and making lifestyle changes as recommended by your health care provider. Talk with your health care provider about: Which screenings and tests you should have. A screening is a test that checks for a disease when you have no symptoms. A diet and exercise plan that is right for you. What should I know about screenings and tests to prevent falls? Screening and testing are the best ways to find a health problem early. Early diagnosis and treatment give you the best chance of managing medical conditions that are common after age 28. Certain conditions and lifestyle choices may make you more likely to have a fall. Your health care provider may recommend: Regular vision checks. Poor vision and conditions such as cataracts can make you more likely to have a fall. If you wear glasses, make sure to get your prescription updated if your vision changes. Medicine review. Work with your health care provider to regularly review all of the medicines you are taking, including over-the-counter medicines. Ask your health care provider about any side effects that may make you more likely to have a fall. Tell your health care provider if any medicines that you take make you feel dizzy or sleepy. Strength and balance checks. Your health care provider may recommend certain tests to  check your strength and balance while standing, walking, or changing positions. Foot health exam. Foot pain and numbness, as well as not wearing proper footwear, can make you more likely to have a fall. Screenings, including: Osteoporosis screening. Osteoporosis is a condition that causes the bones to get weaker and break more easily. Blood pressure screening. Blood pressure changes and medicines to control blood pressure can make you feel dizzy. Depression screening. You may be more likely to have a fall if you have a fear of falling, feel depressed, or feel unable to do activities that you used to do. Alcohol use screening. Using too much alcohol can affect your balance and may make you more likely to have a fall. Follow these instructions at home: Lifestyle Do not drink alcohol if: Your health care provider tells you not to drink. If you drink alcohol: Limit how much you have to: 0-1 drink a day for women. 0-2 drinks a day for men. Know how much alcohol is in your drink. In the [place] one drink equals one 12 oz bottle of beer (355 mL), one 5 oz glass of wine (148 mL), or one 1 oz glass of hard liquor (44 mL). Do not use any products that contain nicotine or tobacco. These products include cigarettes, chewing tobacco, and vaping devices, such as e-cigarettes. If you need help quitting, ask your health care provider. Activity  Follow a regular exercise program to stay fit. This will help you maintain your balance. Ask your health care provider what types of exercise are appropriate  for you. If you need a cane or walker, use it as recommended by your health care provider. Wear supportive shoes that have nonskid soles. Safety  Remove any tripping hazards, such as rugs, cords, and clutter. Install safety equipment such as grab bars in bathrooms and safety rails on stairs. Keep rooms and walkways well-lit. General instructions Talk with your health care provider about your risks for falling.  Tell your health care provider if: You fall. Be sure to tell your health care provider about all falls, even ones that seem minor. You feel dizzy, tiredness (fatigue), or off-balance. Take over-the-counter and prescription medicines only as told by your health care provider. These include supplements. Eat a healthy diet and maintain a healthy weight. A healthy diet includes low-fat dairy products, low-fat (lean) meats, and fiber from whole grains, beans, and lots of fruits and vegetables. Stay current with your vaccines. Schedule regular health, dental, and eye exams. Summary Having a healthy lifestyle and getting preventive care can help to protect your health and wellness after age 41. Screening and testing are the best way to find a health problem early and help you avoid having a fall. Early diagnosis and treatment give you the best chance for managing medical conditions that are more common for people who are older than age 68. Falls are a major cause of broken bones and head injuries in people who are older than age 82. Take precautions to prevent a fall at home. Work with your health care provider to learn what changes you can make to improve your health and wellness and to prevent falls. This information is not intended to replace advice given to you by your health care provider. Make sure you discuss any questions you have with your health care provider. Document Revised: 11/04/2020 Document Reviewed: 11/04/2020 Elsevier Patient Education  [place].

## 2022-04-24 NOTE — Assessment & Plan Note (Signed)
Preventative protocols reviewed and updated unless pt declined. Discussed healthy diet and lifestyle.  

## 2022-04-24 NOTE — Assessment & Plan Note (Signed)
Advanced directive - doesn't have. Packet provided today. Husband would be HCPOA.

## 2022-04-24 NOTE — Progress Notes (Signed)
Patient ID: DECKLYN HORNIK, female    DOB: 14-Mar-1956, 66 y.o.   MRN: [id number]  This visit was conducted in person.  BP (!) 142/78   Pulse 83   Temp 98 F (36.7 C) (Temporal)   Ht 5' 2.5" (1.588 m)   Wt 221 lb 8 oz (100.5 kg)   SpO2 96%   BMI 39.87 kg/m    CC: AMW Subjective:   HPI: Stephanie Mullen is a 66 y.o. female presenting on 04/24/2022 for Transitions Of Care (Here for [facility] from Dr. Einar Pheasant and [facility].  Also, c/o chest pain. Thinks due to a fall or anxiety. )   Did not see health advisor this year.  Transferring care from Dr Einar Pheasant who left the practice. I see her husband Jenny Reichmann.  Hearing Screening   _0  _1  _2  _3   Right ear _4 Left ear _5 40  Vision Screening - Comments:: Last eye exam, 01/2022.  [place] Office Visit from 04/24/2022 in [place] at [facility]  PHQ-2 Total Score 0          04/24/2022   11:19 AM 04/23/2021    8:41 AM 09/07/2019    9:35 AM 09/07/2019    9:19 AM  Fall Risk   Falls in the past year? 1 0 0 0  Number falls in past yr: 1 0 0 0  Injury with Fall? 1  0 0  Comment B lateral knee injuries, back pain, B hip pain     Fall 12/2021 while visiting sister - missed step going upstairs, felt on both hands and knees. [place] again 2 more times that same week onto hands and knees, then slipped on bubbles and landed on right side of body. Since then noticing worsening hip pain (described as posterior buttock pain with some radiation down legs) and left knee pain - activity limiting - had to use cane 03/2022 on trip to [facility], managed with aleve. Also having discomfort between shoulder blades as well as exertional chest pressure, mild dyspnea, rare nausea. No fevers/chills, cough, dizziness or racing heart. Tried husband's nitro tablet with improvement. H/o coronary catheterization 2015 - 30% heart blockage per patient.   H/o thrombocytopenia to 50s, needed treatment in hospital for this. Hesitant to be on  aspirin for this reason.   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 Planning to transition to work from home or fully retiring.  She's been exercising at the pool once a week (water aerobics).   Preventative: Colon cancer screening - discussed, prefers iFOB. Remote colonoscopy with [facility] clinic - referral request  Mammogram 01/2022 - [facility] @ [facility] woman exam - followed by PCP  DEXA scan 09/2019 [place] - T -1.3 at R femur neck, not at increased risk of fracture  Lung cancer screening - not eligible  Flu shot -today  COVID shot - Pfizer 2/201, 08/2019, booster 03/2020 Tdap 2018 Prevnar-13 2021 Shingrix - 11/2018, 02/2019  Advanced directive - doesn't have. Packet provided today. Husband would be HCPOA.  Seat belt use discussed Sunscreen use discussed. No changing moles on skin. Non smoker Alcohol  - none Dentist - q6 mo Eye exam - yearly Bowel - constipation managed with diet Bladder -  overactive bladder with overflow incontinence, no stress incontinence or urge incontinence      Relevant past medical, surgical, family and social history reviewed and updated as indicated. Interim medical history since our last  visit reviewed. Allergies and medications reviewed and updated. Outpatient Medications Prior to Visit  Medication Sig Dispense Refill   acetaminophen (TYLENOL) 325 MG tablet Take 650 mg by mouth every 6 (six) hours as needed.     esomeprazole (NEXIUM) 40 MG capsule Take 1 capsule by mouth daily in the morning 30 capsule 5   amLODipine (NORVASC) 5 MG tablet TAKE 1 TABLET BY MOUTH ONCE DAILY . APPOINTMENT REQUIRED FOR FUTURE REFILLS 90 tablet 3   No facility-administered medications prior to visit.     Per HPI unless specifically indicated in ROS section below Review of Systems  Constitutional:  Negative for activity change, appetite change, chills, fatigue, fever and unexpected weight change.  HENT:  Negative for  hearing loss.   Eyes:  Negative for visual disturbance.  Respiratory:  Positive for chest tightness and shortness of breath. Negative for cough and wheezing.   Cardiovascular:  Positive for palpitations. Negative for chest pain and leg swelling.  Gastrointestinal:  Negative for abdominal distention, abdominal pain, blood in stool, constipation, diarrhea, nausea and vomiting.  Genitourinary:  Negative for difficulty urinating and hematuria.  Musculoskeletal:  Positive for arthralgias. Negative for myalgias and neck pain.  Skin:  Negative for rash.  Neurological:  Negative for dizziness, seizures, syncope and headaches.  Hematological:  Negative for adenopathy. Bruises/bleeds easily.  Psychiatric/Behavioral:  Negative for dysphoric mood. The patient is not nervous/anxious.     Objective:  BP (!) 142/78   Pulse 83   Temp 98 F (36.7 C) (Temporal)   Ht 5' 2.5" (1.588 m)   Wt 221 lb 8 oz (100.5 kg)   SpO2 96%   BMI 39.87 kg/m   Wt Readings from Last 3 Encounters:  04/24/22 221 lb 8 oz (100.5 kg)  07/24/21 211 lb (95.7 kg)  04/23/21 200 lb (90.7 kg)      Physical Exam Vitals and nursing note reviewed.  Constitutional:      Appearance: Normal appearance. She is not ill-appearing.  HENT:     Head: Normocephalic and atraumatic.     Right Ear: Tympanic membrane, ear canal and external ear normal. There is no impacted cerumen.     Left Ear: Tympanic membrane, ear canal and external ear normal. There is no impacted cerumen.     Mouth/Throat:     Comments: Wearing mask Eyes:     General:        Right eye: No discharge.        Left eye: No discharge.     Extraocular Movements: Extraocular movements intact.     Conjunctiva/sclera: Conjunctivae normal.     Pupils: Pupils are equal, round, and reactive to light.  Neck:     Thyroid: No thyroid mass or thyromegaly.     Vascular: No carotid bruit.  Cardiovascular:     Rate and Rhythm: Normal rate and regular rhythm.     Pulses: Normal  pulses.     Heart sounds: Normal heart sounds. No murmur heard. Pulmonary:     Effort: Pulmonary effort is normal. No respiratory distress.     Breath sounds: Normal breath sounds. No wheezing, rhonchi or rales.  Abdominal:     General: Bowel sounds are normal. There is no distension.     Palpations: Abdomen is soft. There is no mass.     Tenderness: There is no abdominal tenderness. There is no guarding or rebound.     Hernia: No hernia is present.  Musculoskeletal:     Cervical back: Normal range  of motion and neck supple. No rigidity.     Right lower leg: No edema.     Left lower leg: No edema.  Lymphadenopathy:     Cervical: No cervical adenopathy.  Skin:    General: Skin is warm and dry.     Findings: No rash.  Neurological:     General: No focal deficit present.     Mental Status: She is alert. Mental status is at baseline.     Comments:  Recall 3/3 Calculation 5/5 DLROW  Psychiatric:        Mood and Affect: Mood normal.        Behavior: Behavior normal.       Results for orders placed or performed in visit on 04/24/22  Lipid panel  Result Value Ref Range   Cholesterol 173 0 - 200 mg/dL   Triglycerides 116.0 0.0 - 149.0 mg/dL   HDL 46.70 >39.00 mg/dL   VLDL 23.2 0.0 - 40.0 mg/dL   LDL Cholesterol 103 (H) 0 - 99 mg/dL   Total CHOL/HDL Ratio 4    NonHDL 126.38   Comprehensive metabolic panel  Result Value Ref Range   Sodium 138 135 - 145 mEq/L   Potassium 3.9 3.5 - 5.1 mEq/L   Chloride 102 96 - 112 mEq/L   CO2 29 19 - 32 mEq/L   Glucose, Bld 89 70 - 99 mg/dL   BUN 19 6 - 23 mg/dL   Creatinine, Ser 0.81 0.40 - 1.20 mg/dL   Total Bilirubin 0.9 0.2 - 1.2 mg/dL   Alkaline Phosphatase 102 39 - 117 U/L   AST 15 0 - 37 U/L   ALT 13 0 - 35 U/L   Total Protein 7.0 6.0 - 8.3 g/dL   Albumin 4.2 3.5 - 5.2 g/dL   GFR 74.86 >60.00 mL/min   Calcium 9.5 8.4 - 10.5 mg/dL  TSH  Result Value Ref Range   TSH 1.35 0.35 - 5.50 uIU/mL  Hemoglobin A1c  Result Value Ref Range    Hgb A1c MFr Bld 6.2 4.6 - 6.5 %  CBC with Differential/Platelet  Result Value Ref Range   WBC 10.1 4.0 - 10.5 K/uL   RBC 4.58 3.87 - 5.11 Mil/uL   Hemoglobin 12.9 12.0 - 15.0 g/dL   HCT 39.6 36.0 - 46.0 %   MCV 86.5 78.0 - 100.0 fl   MCHC 32.5 30.0 - 36.0 g/dL   RDW 14.0 11.5 - 15.5 %   Platelets 217.0 150.0 - 400.0 K/uL   Neutrophils Relative % 52.1 43.0 - 77.0 %   Lymphocytes Relative 39.2 12.0 - 46.0 %   Monocytes Relative 5.8 3.0 - 12.0 %   Eosinophils Relative 2.4 0.0 - 5.0 %   Basophils Relative 0.5 0.0 - 3.0 %   Neutro Abs 5.3 1.4 - 7.7 K/uL   Lymphs Abs 4.0 0.7 - 4.0 K/uL   Monocytes Absolute 0.6 0.1 - 1.0 K/uL   Eosinophils Absolute 0.2 0.0 - 0.7 K/uL   Basophils Absolute 0.1 0.0 - 0.1 K/uL  VITAMIN D 25 Hydroxy (Vit-D Deficiency, Fractures)  Result Value Ref Range   VITD 26.22 (L) 30.00 - 100.00 ng/mL  Microalbumin / creatinine urine ratio  Result Value Ref Range   Microalb, Ur <0.7 0.0 - 1.9 mg/dL   Creatinine,U 105.1 mg/dL   Microalb Creat Ratio 0.7 0.0 - 30.0 mg/g       04/24/2022   12:18 PM 07/24/2021    9:37 AM 04/23/2021    9:20 AM  04/23/2021    8:57 AM 02/06/2020    9:11 AM  Depression screen PHQ 2/9  Decreased Interest 0 0 0 0 0  Down, Depressed, Hopeless 0 0 0 0 0  PHQ - 2 Score 0 0 0 0 0  Altered sleeping 3 0 0    Tired, decreased energy 3 0 0    Change in appetite 3 0 1    Feeling bad or failure about yourself  0 0 0    Trouble concentrating 0 0 0    Moving slowly or fidgety/restless 0 0 0    Suicidal thoughts 0 0 0    PHQ-9 Score 9 0 1    Difficult doing work/chores Somewhat difficult  Not difficult at all  Not difficult at all       04/24/2022   12:18 PM  GAD 7 : Generalized Anxiety Score  Nervous, Anxious, on Edge 1  Control/stop worrying 0  Worry too much - different things 0  Trouble relaxing 0  Restless 1  Easily annoyed or irritable 2  Afraid - awful might happen 0  Total GAD 7 Score 4  Anxiety Difficulty Not difficult at all    EKG - NSR rate 75, normal axis, intervals, no hypertrophy or acute ST/T changes, noncontiguous q waves lead III similar to prior  Assessment & Plan:   Problem List Items Addressed This Visit     Medicare annual wellness visit, subsequent - Primary (Chronic)    I have personally reviewed the Medicare Annual Wellness questionnaire and have noted 1. The patient's medical and social history 2. Their use of alcohol, tobacco or illicit drugs 3. Their current medications and supplements 4. The patient's functional ability including ADL's, fall risks, home safety risks and hearing or visual impairment. Cognitive function has been assessed and addressed as indicated.  5. Diet and physical activity 6. Evidence for depression or mood disorders The patients weight, height, BMI have been recorded in the chart. I have made referrals, counseling and provided education to the patient based on review of the above and I have provided the pt with a written personalized care plan for preventive services. Provider list updated.. See scanned questionairre as needed for further documentation. Reviewed preventative protocols and updated unless pt declined.       Advanced directives, counseling/discussion (Chronic)    Advanced directive - doesn't have. Packet provided today. Husband would be HCPOA.       Chest discomfort    Some concerning symptoms however also atypical features. Check EKG today.  Did not start aspirin in h/o prior severe thrombocytopenia.  Will refer to cardiology for further evaluation. Pt agrees with plan.       Relevant Orders   EKG 12-Lead (Completed)   TSH (Completed)   CBC with Differential/Platelet (Completed)   Ambulatory referral to Cardiology   CAD in native artery    H/o nonobstructive CAD on prior catheterization 2015.       Relevant Medications   atorvastatin (LIPITOR) 20 MG tablet   amLODipine (NORVASC) 5 MG tablet   Other Relevant Orders   Ambulatory referral  to Cardiology   Severe obesity (BMI 35.0-39.9) with comorbidity ([place])    Encouraged healthy diet and lifestyle for goal sustainable weight loss. Obesity is complicated by CAD, HTN, prediabetes, asthma and HLD.       Essential hypertension    Chronic, mildly elevated.  Continue amlodipine 17m daily.       Relevant Medications   atorvastatin (LIPITOR) 20  MG tablet   amLODipine (NORVASC) 5 MG tablet   Other Relevant Orders   Microalbumin / creatinine urine ratio (Completed)   Hyperlipidemia    Chronic, previously on atorvastatin but not any longer - will recommend she restart this.  The 10-year ASCVD risk score (Arnett DK, et al., 2019) is: 11.1%   Values used to calculate the score:     Age: 23 years     Sex: Female     Is Non-Hispanic African American: No     Diabetic: No     Tobacco smoker: No     Systolic Blood Pressure: 716 mmHg     Is BP treated: Yes     HDL Cholesterol: 46.7 mg/dL     Total Cholesterol: 173 mg/dL       Relevant Medications   atorvastatin (LIPITOR) 20 MG tablet   amLODipine (NORVASC) 5 MG tablet   Other Relevant Orders   Lipid panel (Completed)   Comprehensive metabolic panel (Completed)   TSH (Completed)   Prediabetes    Check A1c. Encouraged limiting added sugars/carbs in diet.       Relevant Orders   Hemoglobin A1c (Completed)   Osteopenia    Reviewed latest DEXA 2021 showing osteopenia. reviewdd aclcium and vit D intake, with repeat DEXA 5 years from last.  Check vitamin D level      Relevant Orders   VITAMIN D 25 Hydroxy (Vit-D Deficiency, Fractures) (Completed)   GERD (gastroesophageal reflux disease)    She continues nexium PRN      Lumbar disc disease    She is s/p R L4/5 hemilaminectomy and discectomy 2003 for L4/5 HNP.  Notes worsening lower back and buttock pain since falls earlier this year.  Given location of pain, this is not consistent with hip arthritis or bursitis. Consider imaging if not improving over time.        Other Visit Diagnoses     Special screening for malignant neoplasms, colon       Relevant Orders   Fecal occult blood, imunochemical   Need for influenza vaccination       Relevant Orders   Flu Vaccine QUAD High Dose(Fluad) (Completed)        Meds ordered this encounter  Medications   DISCONTD: aspirin EC 81 MG tablet    Sig: Take 1 tablet (81 mg total) by mouth daily. Swallow whole.   Cholecalciferol (VITAMIN D3) 25 MCG (1000 UT) CAPS    Sig: Take 1 capsule (1,000 Units total) by mouth daily.    Dispense:  30 capsule   atorvastatin (LIPITOR) 20 MG tablet    Sig: Take 1 tablet (20 mg total) by mouth daily.    Dispense:  90 tablet    Refill:  3   amLODipine (NORVASC) 5 MG tablet    Sig: Take one tablet by mouth daily    Dispense:  90 tablet    Refill:  3   Orders Placed This Encounter  Procedures   Fecal occult blood, imunochemical    Standing Status:   Future    Standing Expiration Date:   04/26/2023   Flu Vaccine QUAD High Dose(Fluad)   Lipid panel   Comprehensive metabolic panel   TSH   Hemoglobin A1c   CBC with Differential/Platelet   VITAMIN D 25 Hydroxy (Vit-D Deficiency, Fractures)   Microalbumin / creatinine urine ratio   Ambulatory referral to Cardiology    Referral Priority:   Routine    Referral Type:   Consultation  Referral Reason:   Specialty Services Required    Requested Specialty:   Cardiology    Number of Visits Requested:   1   EKG 12-Lead    Patient instructions: Flu shot today Labs today  Pass by lab for stool kit.  We will refer you back to cardiology for evaluation.  Advanced directive packet provided today  We will request records of colonoscopy from [facility] ~2012.  Return to see me in 3 months for follow up, sooner if needed.   Follow up plan: Return in about 3 months (around 07/25/2022), or if symptoms worsen or fail to improve, for follow up visit.  Ria Bush, MD

## 2022-04-24 NOTE — Assessment & Plan Note (Signed)

## 2022-04-25 ENCOUNTER — Encounter: Payer: Self-pay | Admitting: Family Medicine

## 2022-04-25 MED ORDER — ATORVASTATIN CALCIUM 20 MG PO TABS: 20.0000 mg | ORAL_TABLET | Freq: Every day | ORAL | 3 refills | Status: DC

## 2022-04-25 MED ORDER — VITAMIN D3 25 MCG (1000 UT) PO CAPS: 1.0000 | ORAL_CAPSULE | Freq: Every day | ORAL | Status: AC

## 2022-04-25 MED ORDER — AMLODIPINE BESYLATE 5 MG PO TABS: ORAL_TABLET | ORAL | 3 refills | Status: DC

## 2022-04-25 NOTE — Assessment & Plan Note (Signed)
Some concerning symptoms however also atypical features. Check EKG today.  Did not start aspirin in h/o prior severe thrombocytopenia.  Will refer to cardiology for further evaluation. Pt agrees with plan.

## 2022-04-25 NOTE — Assessment & Plan Note (Addendum)
Check A1c. Encouraged limiting added sugars/carbs in diet.

## 2022-04-25 NOTE — Assessment & Plan Note (Addendum)
Reviewed latest DEXA 2021 showing osteopenia. reviewdd aclcium and vit D intake, with repeat DEXA 5 years from last.  Check vitamin D level

## 2022-04-25 NOTE — Assessment & Plan Note (Signed)
Chronic, mildly elevated.  Continue amlodipine 5mg  daily.

## 2022-04-25 NOTE — Assessment & Plan Note (Signed)
Chronic, previously on atorvastatin but not any longer - will recommend she restart this.  The 10-year ASCVD risk score (Arnett DK, et al., 2019) is: 11.1%   Values used to calculate the score:     Age: 66 years     Sex: Female     Is Non-Hispanic African American: No     Diabetic: No     Tobacco smoker: No     Systolic Blood Pressure: 240 mmHg     Is BP treated: Yes     HDL Cholesterol: 46.7 mg/dL     Total Cholesterol: 173 mg/dL

## 2022-04-25 NOTE — Assessment & Plan Note (Signed)
Encouraged healthy diet and lifestyle for goal sustainable weight loss. Obesity is complicated by CAD, HTN, prediabetes, asthma and HLD.

## 2022-04-25 NOTE — Assessment & Plan Note (Signed)
She continues nexium PRN

## 2022-04-25 NOTE — Assessment & Plan Note (Signed)
She is s/p R L4/5 hemilaminectomy and discectomy 2003 for L4/5 HNP.  Notes worsening lower back and buttock pain since falls earlier this year.  Given location of pain, this is not consistent with hip arthritis or bursitis. Consider imaging if not improving over time.

## 2022-04-25 NOTE — Assessment & Plan Note (Addendum)
H/o nonobstructive CAD on prior catheterization 2015.

## 2022-04-29 ENCOUNTER — Ambulatory Visit: Payer: Medicare Other | Admitting: Family Medicine

## 2022-05-01 ENCOUNTER — Other Ambulatory Visit (INDEPENDENT_AMBULATORY_CARE_PROVIDER_SITE_OTHER): Payer: Medicare Other | Admitting: *Deleted

## 2022-05-01 DIAGNOSIS — Z1211 Encounter for screening for malignant neoplasm of colon: Secondary | ICD-10-CM

## 2022-05-01 LAB — FECAL OCCULT BLOOD, IMMUNOCHEMICAL: Fecal Occult Bld: NEGATIVE

## 2022-05-04 ENCOUNTER — Encounter: Payer: Self-pay | Admitting: Family Medicine

## 2022-05-05 ENCOUNTER — Encounter: Payer: Self-pay | Admitting: Family Medicine

## 2022-06-18 ENCOUNTER — Other Ambulatory Visit
Admission: RE | Admit: 2022-06-18 | Discharge: 2022-06-18 | Disposition: A | Discharge status: Home / Self Care | Payer: Medicare Other | Source: Ambulatory Visit | Attending: Internal Medicine | Admitting: Internal Medicine

## 2022-06-18 ENCOUNTER — Encounter: Payer: Self-pay | Admitting: Internal Medicine

## 2022-06-18 ENCOUNTER — Ambulatory Visit: Payer: Medicare Other | Attending: Internal Medicine | Admitting: Internal Medicine

## 2022-06-18 VITALS — BP 146/90 | HR 85 | Ht 61.0 in | Wt 224.0 lb

## 2022-06-18 DIAGNOSIS — E782 Mixed hyperlipidemia: Secondary | ICD-10-CM | POA: Diagnosis not present

## 2022-06-18 DIAGNOSIS — R079 Chest pain, unspecified: Secondary | ICD-10-CM | POA: Insufficient documentation

## 2022-06-18 DIAGNOSIS — I200 Unstable angina: Secondary | ICD-10-CM | POA: Diagnosis present

## 2022-06-18 DIAGNOSIS — I10 Essential (primary) hypertension: Secondary | ICD-10-CM

## 2022-06-18 LAB — CBC
HCT: 41.3 % (ref 36.0–46.0)
Hemoglobin: 13 g/dL (ref 12.0–15.0)
MCH: 27.9 pg (ref 26.0–34.0)
MCHC: 31.5 g/dL (ref 30.0–36.0)
MCV: 88.6 fL (ref 80.0–100.0)
Platelets: 232 10*3/uL (ref 150–400)
RBC: 4.66 MIL/uL (ref 3.87–5.11)
RDW: 13 % (ref 11.5–15.5)
WBC: 11.8 10*3/uL — ABNORMAL HIGH (ref 4.0–10.5)
nRBC: 0 % (ref 0.0–0.2)

## 2022-06-18 LAB — BASIC METABOLIC PANEL
Anion gap: 8 (ref 5–15)
BUN: 17 mg/dL (ref 8–23)
CO2: 26 mmol/L (ref 22–32)
Calcium: 9.2 mg/dL (ref 8.9–10.3)
Chloride: 106 mmol/L (ref 98–111)
Creatinine, Ser: 0.9 mg/dL (ref 0.44–1.00)
GFR, Estimated: >60 mL/min (ref >60)
Glucose, Bld: 126 mg/dL — ABNORMAL HIGH (ref 70–99)
Potassium: 3.8 mmol/L (ref 3.5–5.1)
Sodium: 140 mmol/L (ref 135–145)

## 2022-06-18 MED ORDER — ASPIRIN 81 MG PO TBEC: 81.0000 mg | DELAYED_RELEASE_TABLET | Freq: Every day | ORAL | 3 refills | Status: AC

## 2022-06-18 MED ORDER — METOPROLOL TARTRATE 25 MG PO TABS: 12.5000 mg | ORAL_TABLET | Freq: Two times a day (BID) | ORAL | 3 refills | Status: DC

## 2022-06-18 MED ORDER — NITROGLYCERIN 0.4 MG SL SUBL: 0.4000 mg | SUBLINGUAL_TABLET | SUBLINGUAL | 3 refills | Status: DC | PRN

## 2022-06-18 NOTE — Patient Instructions (Addendum)
Medication Instructions:  Your physician recommends the following medication changes.  START TAKING: Metoprolol tartrate 12.5 mg by mouth twice a day Nitroglycerin 0.4 mg as needed (see instructions below)  For as needed Nitroglycerin, if you develop chest pain: Sit and rest 5 minutes. If chest pain does not resolve place 1 nitroglycerin under your tongue and wait 5 minutes. If chest pain does not resolve, place a 2nd nitroglycerin under your tongue and wait 5 more minutes. If chest pain does not resolve, place a 3rd nitroglycerin under your tongue and seek emergency services.     *If you need a refill on your cardiac medications before your next appointment, please call your pharmacy*   Lab Work: Your provider would like for you to have following labs drawn: (CBC, BMP).   Please go to the [facility] entrance and check in at the front desk.  You do not need an appointment.  They are open from 7am-6 pm.   If you have labs (blood work) drawn today and your tests are completely normal, you will receive your results only by: MyChart Message (if you have MyChart) OR A paper copy in the mail If you have any lab test that is abnormal or we need to change your treatment, we will call you to review the results.   Testing/Procedures: Your physician has requested that you have a cardiac catheterization. Cardiac catheterization is used to diagnose and/or treat various heart conditions. Doctors may recommend this procedure for a number of different reasons. The most common reason is to evaluate chest pain. Chest pain can be a symptom of coronary artery disease (CAD), and cardiac catheterization can show whether plaque is narrowing or blocking your heart's arteries. This procedure is also used to evaluate the valves, as well as measure the blood flow and oxygen levels in different parts of your heart. For further information please visit [email or web address] Please follow instruction sheet, as  given.    Follow-Up: At [facility], you and your health needs are our priority.  As part of our continuing mission to provide you with exceptional heart care, we have created designated Provider Care Teams.  These Care Teams include your primary Cardiologist (physician) and Advanced Practice Providers (APPs -  Physician Assistants and Nurse Practitioners) who all work together to provide you with the care you need, when you need it.  We recommend signing up for the patient portal called "MyChart".  Sign up information is provided on this After Visit Summary.  MyChart is used to connect with patients for Virtual Visits (Telemedicine).  Patients are able to view lab/test results, encounter notes, upcoming appointments, etc.  Non-urgent messages can be sent to your provider as well.   To learn more about what you can do with MyChart, go to [email or web address].    Your next appointment:   2 week(s)  The format for your next appointment:   In Person  Provider:   You may see Yvonne Kendall, MD or one of the following Advanced Practice Providers on your designated Care Team:   Nicolasa Ducking, NP Eula Listen, PA-C Cadence Fransico Michael, PA-C Charlsie Quest, NP      [facility] [address] [place] [address] Dept: [phone] Loc: [phone]   Cardiac/Peripheral Catheterization   You are scheduled for a Cardiac Catheterization on Friday, December 22 with Dr. Lorine Bears.  1. Arrive at the [facility] entrance at 8:30 am, one hour prior to your procedure. Free valet service is available.  After entering the [facility] please check-in at the registration desk (1st desk on your right) to receive your armband. After receiving your armband someone will escort you to the cardiac cath/special procedures waiting area. The support person will be asked to wait in the waiting room.  It is OK to have someone drop you off and come back when you are ready  to be discharged.        Special note: Every effort is made to have your procedure done on time. Please understand that emergencies sometimes delay scheduled procedures.   . 2. Diet: Do not eat solid foods after midnight.  You may have clear liquids until 5 AM the day of the procedure.  3. Labs: You will need to have blood drawn today (CBC, BMP)  4. Medication instructions in preparation for your procedure:   Contrast Allergy: No   On the morning of your procedure, take Aspirin 81 mg and any morning medicines NOT listed above.  You may use sips of water.  5. Plan to go home the same day, you will only stay overnight if medically necessary. 6. You MUST have a responsible adult to drive you home. 7. An adult MUST be with you the first 24 hours after you arrive home. 8. Bring a current list of your medications, and the last time and date medication taken. 9. Bring ID and current insurance cards. 10.Please wear clothes that are easy to get on and off and wear slip-on shoes.  Thank you for allowing [place] to care for you!   -- [facility]

## 2022-06-18 NOTE — Progress Notes (Signed)
New Outpatient Visit Date: 06/18/2022  Referring Provider: Eustaquio Boyden, MD [address] [place],  [place] [address]  Chief Complaint: Chest pain  HPI:  Stephanie Mullen is a 66 y.o. female who is being seen today for the evaluation of chest pain at the request of Dr. Sharen Hones. She has a history of hypertension and thrombocytopenia.  I saw her once in 07/2018 for evaluation of chest pain and fatigue.  She had undergone cardiac catheterization in 2015 at [facility], which showed nonobstructive CAD.  Symptoms had already started improving with addition of Nexium and Symbicort.  We agreed to obtain an echocardiogram, which showed preserved LVEF with grade 1 diastolic dysfunction and no other significant abnormalities.  She was subsequently lost to follow-up.  Today, Stephanie Mullen reports that she has been experiencing chest pressure for the last 4 months.  It seems to be getting more frequent and is now present almost every time she walks for more than a few feet.  The pain is located in the center of the chest and does not radiate.  However, she also experiences associated jaw pain and headaches at times.  The once took a sublingual NTG from her husband a few weeks ago and felt like it helped her relax.  She complains of progressive exertional dyspnea and fatigue as well.  She noted occasional palpitations as well as sporadic lightheadedness without syncope.  She fell 3 times in July; all episodes were due to tripping over something.  Stephanie Mullen reports a history of thrombocytopenia >15 years ago.  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  --------------------------------------------------------------------------------------------------  Cardiovascular History & Procedures: Cardiovascular Problems: Chest pain with nonobstructive coronary artery disease by catheterization  Risk Factors: Nonobstructive coronary artery disease,  hypertension, and obesity  Cath/PCI: LHC (12/01/2013, [facility]): LMCA normal. LAD with 20% mid vessel stenosis. Ramus normal. LCx normal. RCA normal with 30% stenosis involving rPDA.   CV Surgery: None  EP Procedures and Devices: None  Non-Invasive Evaluation(s): TTE (09/09/2018): Normal LV size with mild LVH.  LVEF 60-65% with grade 1 diastolic dysfunction.  Normal RV size and function.  Normal biatrial size.  Mild mitral and tricuspid regurgitation.  Normal CVP.  Recent CV Pertinent Labs: Lab Results  Component Value Date   CHOL 173 04/24/2022   HDL 46.70 04/24/2022   LDLCALC 103 (H) 04/24/2022   TRIG 116.0 04/24/2022   CHOLHDL 4 04/24/2022   K 3.9 04/24/2022   BUN 19 04/24/2022   BUN 14 05/03/2018   CREATININE 0.81 04/24/2022    --------------------------------------------------------------------------------------------------  Past Medical History:  Diagnosis Date   Arthritis    Asthma    Coronary artery disease    GERD (gastroesophageal reflux disease)    Hypertension     Past Surgical History:  Procedure Laterality Date   CARDIAC CATHETERIZATION  2015   nonobstructive CAD, nromal LV function ([facility] [place])   COLONOSCOPY  06/2008   WNL, rpt 10 yrs [facility])   LUMBAR DISC SURGERY  2003   R L4/5 hemilaminectomy and discectomy 2003 for L4/5 HNP   TONSILLECTOMY      Current Meds  Medication Sig   acetaminophen (TYLENOL) 325 MG tablet Take 650 mg by mouth every 6 (six) hours as needed.   amLODipine (NORVASC) 5 MG tablet Take one tablet by mouth daily   atorvastatin (LIPITOR) 20 MG tablet Take 1 tablet (20 mg total) by mouth daily.   Cholecalciferol (VITAMIN D3)  25 MCG (1000 UT) CAPS Take 1 capsule (1,000 Units total) by mouth daily.   esomeprazole (NEXIUM) 40 MG capsule Take 1 capsule by mouth daily in the morning   triamcinolone (NASACORT) 55 MCG/ACT AERO nasal inhaler Place 2 sprays into the nose daily as needed.    Allergies: Celebrex  [celecoxib]  Social History   Tobacco Use   Smoking status: Never   Smokeless tobacco: Never  Vaping Use   Vaping Use: Never used  Substance Use Topics   Alcohol use: Yes    Comment: 1-2 times a year, 1 drink   Drug use: Never    Family History  Problem Relation Age of Onset   Asthma Mother    Diabetes Mother    Allergies Father    Heart disease Father        has several blockages   Cystic fibrosis Sister    Obesity Brother        morbid   Sleep apnea Brother    Rheum arthritis Paternal Grandfather     Review of Systems: A 12-system review of systems was performed and was negative except as noted in the HPI.  --------------------------------------------------------------------------------------------------  Physical Exam: BP (!) 146/90 (BP Location: Left Arm, Patient Position: Sitting, Cuff Size: Large)   Pulse 85   Ht 5\' 1"  (1.549 m)   Wt 224 lb (101.6 kg)   SpO2 98%   BMI 42.32 kg/m   General:  NAD. HEENT: No conjunctival pallor or scleral icterus. Neck: Supple without lymphadenopathy, thyromegaly, JVD, or HJR, though body habitus limits evaluation. No carotid bruit. Lungs: Normal work of breathing. Clear to auscultation bilaterally without wheezes or crackles. Heart: Regular rate and rhythm without murmurs, rubs, or gallops. Non-displaced PMI. Abd: Bowel sounds present. Soft, NT/ND without hepatosplenomegaly Ext: Trace pretibial edema bilaterally. Radial, PT, and DP pulses are 2+ bilaterally Skin: Warm and dry without rash. Neuro: CNIII-XII intact. Strength and fine-touch sensation intact in upper and lower extremities bilaterally. Psych: Normal mood and affect.  EKG:  Normal sinus rhythm with 1st degree AV block (PR interval 210 ms) with borderline LVH, poor R wave progression, and possible inferior infarct.  Lab Results  Component Value Date   WBC 10.1 04/24/2022   HGB 12.9 04/24/2022   HCT 39.6 04/24/2022   MCV 86.5 04/24/2022   PLT 217.0 04/24/2022     Lab Results  Component Value Date   NA 138 04/24/2022   K 3.9 04/24/2022   CL 102 04/24/2022   CO2 29 04/24/2022   BUN 19 04/24/2022   CREATININE 0.81 04/24/2022   GLUCOSE 89 04/24/2022   ALT 13 04/24/2022    Lab Results  Component Value Date   CHOL 173 04/24/2022   HDL 46.70 04/24/2022   LDLCALC 103 (H) 04/24/2022   TRIG 116.0 04/24/2022   CHOLHDL 4 04/24/2022    --------------------------------------------------------------------------------------------------  ASSESSMENT AND PLAN: Accelerating angina: Stephanie Mullen has a history of mild CAD by outside cath in 2015.  She reports progressive exertional chest pressure and dyspnea over the last 4 months, concerning for exertional angina.  EKG today does not show any acute ischemic changes.  We have discussed further evaluation options and have agreed to perform a left heart catheterization with possible PCI tomorrow.  We will begin aspirin 81 mg daily and metoprolol tartrate 12.5 mg BID.  We will need to follow PR interval closely with borderline 1st degree AV block on today's EKG.  Current dose of amlodipine will be continued.  I have  also provided Stephanie Mullen with a prescription for SL NTG.  I advised her to call 911 if she has recurrent chest pain that does not resolve promptly with rest and/or NTG before tomorrow's catheterization.  We will check a CBC and BMP today.  Hypertension: BP mildly elevated today.  Continue current dose of amlodipine and add metoprolol tartrate 12.5 mg BID for BP and antianginal therapy.  Hyperlipidemia: LDL mildly elevated on last check in 03/2022.  If significant ASCVD is identified on tomorrow's cath, atorvastatin will need to be increased.  Shared Decision Making/Informed Consent The risks [stroke (1 in 1000), death (1 in 1000), kidney failure [usually temporary] (1 in 500), bleeding (1 in 200), allergic reaction [possibly serious] (1 in 200)], benefits (diagnostic support and management of  coronary artery disease) and alternatives of a cardiac catheterization were discussed in detail with Stephanie Mullen and she is willing to proceed.  Follow-up: Return to clinic in 2 weeks.  Yvonne Kendall, MD 06/18/2022 2:09 PM

## 2022-06-18 NOTE — H&P (View-Only) (Signed)
 New Outpatient Visit Date: 06/18/2022  Referring Provider: Gutierrez, Javier, MD [address] [place],  [place] [address]  Chief Complaint: Chest pain  HPI:  Stephanie Mullen is a 66 y.o. female who is being seen today for the evaluation of chest pain at the request of Dr. Gutierrez. She has a history of hypertension and thrombocytopenia.  I saw her once in 07/2018 for evaluation of chest pain and fatigue.  She had undergone cardiac catheterization in 2015 at [facility], which showed nonobstructive CAD.  Symptoms had already started improving with addition of Nexium and Symbicort.  We agreed to obtain an echocardiogram, which showed preserved LVEF with grade 1 diastolic dysfunction and no other significant abnormalities.  She was subsequently lost to follow-up.  Today, Stephanie Mullen reports that she has been experiencing chest pressure for the last 4 months.  It seems to be getting more frequent and is now present almost every time she walks for more than a few feet.  The pain is located in the center of the chest and does not radiate.  However, she also experiences associated jaw pain and headaches at times.  The once took a sublingual NTG from her husband a few weeks ago and felt like it helped her relax.  She complains of progressive exertional dyspnea and fatigue as well.  She noted occasional palpitations as well as sporadic lightheadedness without syncope.  She fell 3 times in July; all episodes were due to tripping over something.  Stephanie Mullen reports a history of thrombocytopenia >15 years ago.  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  --------------------------------------------------------------------------------------------------  Cardiovascular History & Procedures: Cardiovascular Problems: Chest pain with nonobstructive coronary artery disease by catheterization  Risk Factors: Nonobstructive coronary artery disease,  hypertension, and obesity  Cath/PCI: LHC (12/01/2013, [facility]): LMCA normal. LAD with 20% mid vessel stenosis. Ramus normal. LCx normal. RCA normal with 30% stenosis involving rPDA.   CV Surgery: None  EP Procedures and Devices: None  Non-Invasive Evaluation(s): TTE (09/09/2018): Normal LV size with mild LVH.  LVEF 60-65% with grade 1 diastolic dysfunction.  Normal RV size and function.  Normal biatrial size.  Mild mitral and tricuspid regurgitation.  Normal CVP.  Recent CV Pertinent Labs: Lab Results  Component Value Date   CHOL 173 04/24/2022   HDL 46.70 04/24/2022   LDLCALC 103 (H) 04/24/2022   TRIG 116.0 04/24/2022   CHOLHDL 4 04/24/2022   K 3.9 04/24/2022   BUN 19 04/24/2022   BUN 14 05/03/2018   CREATININE 0.81 04/24/2022    --------------------------------------------------------------------------------------------------  Past Medical History:  Diagnosis Date   Arthritis    Asthma    Coronary artery disease    GERD (gastroesophageal reflux disease)    Hypertension     Past Surgical History:  Procedure Laterality Date   CARDIAC CATHETERIZATION  2015   nonobstructive CAD, nromal LV function (Stephanie Mullen)   COLONOSCOPY  06/2008   WNL, rpt 10 yrs ([place])   LUMBAR DISC SURGERY  2003   R L4/5 hemilaminectomy and discectomy 2003 for L4/5 HNP   TONSILLECTOMY      Current Meds  Medication Sig   acetaminophen (TYLENOL) 325 MG tablet Take 650 mg by mouth every 6 (six) hours as needed.   amLODipine (NORVASC) 5 MG tablet Take one tablet by mouth daily   atorvastatin (LIPITOR) 20 MG tablet Take 1 tablet (20 mg total) by mouth daily.   Cholecalciferol (VITAMIN D3)   25 MCG (1000 UT) CAPS Take 1 capsule (1,000 Units total) by mouth daily.   esomeprazole (NEXIUM) 40 MG capsule Take 1 capsule by mouth daily in the morning   triamcinolone (NASACORT) 55 MCG/ACT AERO nasal inhaler Place 2 sprays into the nose daily as needed.    Allergies: Celebrex  [celecoxib]  Social History   Tobacco Use   Smoking status: Never   Smokeless tobacco: Never  Vaping Use   Vaping Use: Never used  Substance Use Topics   Alcohol use: Yes    Comment: 1-2 times a year, 1 drink   Drug use: Never    Family History  Problem Relation Age of Onset   Asthma Mother    Diabetes Mother    Allergies Father    Heart disease Father        has several blockages   Cystic fibrosis Sister    Obesity Brother        morbid   Sleep apnea Brother    Rheum arthritis Paternal Grandfather     Review of Systems: A 12-system review of systems was performed and was negative except as noted in the HPI.  --------------------------------------------------------------------------------------------------  Physical Exam: BP (!) 146/90 (BP Location: Left Arm, Patient Position: Sitting, Cuff Size: Large)   Pulse 85   Ht 5\' 1"  (1.549 m)   Wt 224 lb (101.6 kg)   SpO2 98%   BMI 42.32 kg/m   General:  NAD. HEENT: No conjunctival pallor or scleral icterus. Neck: Supple without lymphadenopathy, thyromegaly, JVD, or HJR, though body habitus limits evaluation. No carotid bruit. Lungs: Normal work of breathing. Clear to auscultation bilaterally without wheezes or crackles. Heart: Regular rate and rhythm without murmurs, rubs, or gallops. Non-displaced PMI. Abd: Bowel sounds present. Soft, NT/ND without hepatosplenomegaly Ext: Trace pretibial edema bilaterally. Radial, PT, and DP pulses are 2+ bilaterally Skin: Warm and dry without rash. Neuro: CNIII-XII intact. Strength and fine-touch sensation intact in upper and lower extremities bilaterally. Psych: Normal mood and affect.  EKG:  Normal sinus rhythm with 1st degree AV block (PR interval 210 ms) with borderline LVH, poor R wave progression, and possible inferior infarct.  Lab Results  Component Value Date   WBC 10.1 04/24/2022   HGB 12.9 04/24/2022   HCT 39.6 04/24/2022   MCV 86.5 04/24/2022   PLT 217.0 04/24/2022     Lab Results  Component Value Date   NA 138 04/24/2022   K 3.9 04/24/2022   CL 102 04/24/2022   CO2 29 04/24/2022   BUN 19 04/24/2022   CREATININE 0.81 04/24/2022   GLUCOSE 89 04/24/2022   ALT 13 04/24/2022    Lab Results  Component Value Date   CHOL 173 04/24/2022   HDL 46.70 04/24/2022   LDLCALC 103 (H) 04/24/2022   TRIG 116.0 04/24/2022   CHOLHDL 4 04/24/2022    --------------------------------------------------------------------------------------------------  ASSESSMENT AND PLAN: Accelerating angina: Stephanie Mullen has a history of mild CAD by outside cath in 2015.  She reports progressive exertional chest pressure and dyspnea over the last 4 months, concerning for exertional angina.  EKG today does not show any acute ischemic changes.  We have discussed further evaluation options and have agreed to perform a left heart catheterization with possible PCI tomorrow.  We will begin aspirin 81 mg daily and metoprolol tartrate 12.5 mg BID.  We will need to follow PR interval closely with borderline 1st degree AV block on today's EKG.  Current dose of amlodipine will be continued.  I have  also provided Stephanie Mullen with a prescription for SL NTG.  I advised her to call 911 if she has recurrent chest pain that does not resolve promptly with rest and/or NTG before tomorrow's catheterization.  We will check a CBC and BMP today.  Hypertension: BP mildly elevated today.  Continue current dose of amlodipine and add metoprolol tartrate 12.5 mg BID for BP and antianginal therapy.  Hyperlipidemia: LDL mildly elevated on last check in 03/2022.  If significant ASCVD is identified on tomorrow's cath, atorvastatin will need to be increased.  Shared Decision Making/Informed Consent The risks [stroke (1 in 1000), death (1 in 1000), kidney failure [usually temporary] (1 in 500), bleeding (1 in 200), allergic reaction [possibly serious] (1 in 200)], benefits (diagnostic support and management of  coronary artery disease) and alternatives of a cardiac catheterization were discussed in detail with Stephanie Mullen and she is willing to proceed.  Follow-up: Return to clinic in 2 weeks.  Cortlandt Capuano, MD 06/18/2022 2:09 PM  

## 2022-06-19 ENCOUNTER — Ambulatory Visit
Admission: RE | Admit: 2022-06-19 | Discharge: 2022-06-19 | Disposition: A | Discharge status: Home / Self Care | Payer: Medicare Other | Attending: Cardiovascular Disease | Admitting: Cardiovascular Disease

## 2022-06-19 ENCOUNTER — Other Ambulatory Visit: Payer: Self-pay

## 2022-06-19 ENCOUNTER — Encounter: Payer: Self-pay | Admitting: Cardiovascular Disease

## 2022-06-19 ENCOUNTER — Encounter
Admission: RE | Disposition: A | Discharge status: Home / Self Care | Payer: Self-pay | Source: Home / Self Care | Attending: Cardiovascular Disease

## 2022-06-19 DIAGNOSIS — I25110 Atherosclerotic heart disease of native coronary artery with unstable angina pectoris: Secondary | ICD-10-CM | POA: Diagnosis not present

## 2022-06-19 DIAGNOSIS — I25119 Atherosclerotic heart disease of native coronary artery with unspecified angina pectoris: Secondary | ICD-10-CM | POA: Diagnosis not present

## 2022-06-19 DIAGNOSIS — E669 Obesity, unspecified: Secondary | ICD-10-CM | POA: Diagnosis not present

## 2022-06-19 DIAGNOSIS — I200 Unstable angina: Secondary | ICD-10-CM

## 2022-06-19 DIAGNOSIS — Z79899 Other long term (current) drug therapy: Secondary | ICD-10-CM | POA: Diagnosis not present

## 2022-06-19 DIAGNOSIS — E785 Hyperlipidemia, unspecified: Secondary | ICD-10-CM | POA: Insufficient documentation

## 2022-06-19 DIAGNOSIS — Z6841 Body Mass Index (BMI) 40.0 and over, adult: Secondary | ICD-10-CM | POA: Diagnosis not present

## 2022-06-19 DIAGNOSIS — I10 Essential (primary) hypertension: Secondary | ICD-10-CM | POA: Insufficient documentation

## 2022-06-19 HISTORY — PX: LEFT HEART CATH AND CORONARY ANGIOGRAPHY: CATH118249

## 2022-06-19 SURGERY — LEFT HEART CATH AND CORONARY ANGIOGRAPHY
Anesthesia: Moderate Sedation | Laterality: Left

## 2022-06-19 MED ORDER — VERAPAMIL HCL 2.5 MG/ML IV SOLN
INTRAVENOUS | Status: DC | PRN
  Administered 2022-06-19: 2.5 mg via INTRA_ARTERIAL

## 2022-06-19 MED ORDER — SODIUM CHLORIDE 0.9 % WEIGHT BASED INFUSION: 1.0000 mL/kg/h | INTRAVENOUS | Status: DC

## 2022-06-19 MED ORDER — SODIUM CHLORIDE 0.9 % IV SOLN: 250.0000 mL | INTRAVENOUS | Status: DC | PRN

## 2022-06-19 MED ORDER — LIDOCAINE HCL (PF) 1 % IJ SOLN: INTRAMUSCULAR | Status: DC | PRN

## 2022-06-19 MED ORDER — HEPARIN (PORCINE) IN NACL 1000-0.9 UT/500ML-% IV SOLN
INTRAVENOUS | Status: DC | PRN
  Administered 2022-06-19 (×2): 500 mL

## 2022-06-19 MED ORDER — ASPIRIN 81 MG PO CHEW: 81.0000 mg | CHEWABLE_TABLET | ORAL | Status: DC

## 2022-06-19 MED ORDER — SODIUM CHLORIDE 0.9 % WEIGHT BASED INFUSION
3.0000 mL/kg/h | INTRAVENOUS | Status: AC
  Administered 2022-06-19: 3 mL/kg/h via INTRAVENOUS

## 2022-06-19 MED ORDER — MIDAZOLAM HCL 2 MG/2ML IJ SOLN
INTRAMUSCULAR | Status: AC
  Filled 2022-06-19: qty 2

## 2022-06-19 MED ORDER — SODIUM CHLORIDE 0.9% FLUSH: 3.0000 mL | Freq: Two times a day (BID) | INTRAVENOUS | Status: DC

## 2022-06-19 MED ORDER — HEPARIN (PORCINE) IN NACL 1000-0.9 UT/500ML-% IV SOLN
INTRAVENOUS | Status: AC
  Filled 2022-06-19: qty 1000

## 2022-06-19 MED ORDER — FENTANYL CITRATE (PF) 100 MCG/2ML IJ SOLN
INTRAMUSCULAR | Status: DC | PRN
  Administered 2022-06-19: 25 ug via INTRAVENOUS

## 2022-06-19 MED ORDER — MIDAZOLAM HCL 2 MG/2ML IJ SOLN
INTRAMUSCULAR | Status: DC | PRN
  Administered 2022-06-19: 1 mg via INTRAVENOUS

## 2022-06-19 MED ORDER — HEPARIN SODIUM (PORCINE) 1000 UNIT/ML IJ SOLN
INTRAMUSCULAR | Status: AC
  Filled 2022-06-19: qty 10

## 2022-06-19 MED ORDER — HEPARIN SODIUM (PORCINE) 1000 UNIT/ML IJ SOLN
INTRAMUSCULAR | Status: DC | PRN
  Administered 2022-06-19: 5000 [IU] via INTRAVENOUS

## 2022-06-19 MED ORDER — SODIUM CHLORIDE 0.9% FLUSH: 3.0000 mL | INTRAVENOUS | Status: DC | PRN

## 2022-06-19 MED ORDER — IOHEXOL 300 MG/ML  SOLN
INTRAMUSCULAR | Status: DC | PRN
  Administered 2022-06-19: 56 mL

## 2022-06-19 MED ORDER — FENTANYL CITRATE (PF) 100 MCG/2ML IJ SOLN
INTRAMUSCULAR | Status: AC
  Filled 2022-06-19: qty 2

## 2022-06-19 MED ORDER — VERAPAMIL HCL 2.5 MG/ML IV SOLN
INTRAVENOUS | Status: AC
  Filled 2022-06-19: qty 2

## 2022-06-19 SURGICAL SUPPLY — 11 items
CATH INFINITI 5FR JK (CATHETERS) IMPLANT
DEVICE RAD COMP TR BAND LRG (VASCULAR PRODUCTS) IMPLANT
DRAPE BRACHIAL (DRAPES) IMPLANT
GLIDESHEATH SLEND SS 6F .021 (SHEATH) IMPLANT
GUIDEWIRE INQWIRE 1.5J.035X260 (WIRE) IMPLANT
INQWIRE 1.5J .035X260CM (WIRE) ×1
PACK CARDIAC CATH (CUSTOM PROCEDURE TRAY) ×1 IMPLANT
PANNUS RETENTION SYSTEM 2 PAD (MISCELLANEOUS) IMPLANT
PROTECTION STATION PRESSURIZED (MISCELLANEOUS) ×1
SET ATX SIMPLICITY (MISCELLANEOUS) IMPLANT
STATION PROTECTION PRESSURIZED (MISCELLANEOUS) IMPLANT

## 2022-06-19 NOTE — Interval H&P Note (Signed)
History and Physical Interval Note:  06/19/2022 10:58 AM  Stephanie Mullen  has presented today for surgery, with the diagnosis of L Cath   Accelerating angina.  The various methods of treatment have been discussed with the patient and family. After consideration of risks, benefits and other options for treatment, the patient has consented to  Procedure(s): LEFT HEART CATH AND CORONARY ANGIOGRAPHY (Left) as a surgical intervention.  The patient's history has been reviewed, patient examined, no change in status, stable for surgery.  I have reviewed the patient's chart and labs.  Questions were answered to the patient's satisfaction.     Lorine Bears

## 2022-06-19 NOTE — Interval H&P Note (Signed)
History and Physical Interval Note:  06/19/2022 10:55 AM  Stephanie Mullen  has presented today for surgery, with the diagnosis of L Cath   Accelerating angina.  The various methods of treatment have been discussed with the patient and family. After consideration of risks, benefits and other options for treatment, the patient has consented to  Procedure(s): LEFT HEART CATH AND CORONARY ANGIOGRAPHY (Left) as a surgical intervention.  The patient's history has been reviewed, patient examined, no change in status, stable for surgery.  I have reviewed the patient's chart and labs.  Questions were answered to the patient's satisfaction.     Lorine Bears

## 2022-06-24 ENCOUNTER — Encounter: Payer: Self-pay | Admitting: Cardiovascular Disease

## 2022-07-08 ENCOUNTER — Other Ambulatory Visit
Admission: RE | Admit: 2022-07-08 | Discharge: 2022-07-08 | Disposition: A | Discharge status: Home / Self Care | Payer: Medicare Other | Source: Ambulatory Visit | Attending: Internal Medicine | Admitting: Internal Medicine

## 2022-07-08 ENCOUNTER — Encounter: Payer: Self-pay | Admitting: Internal Medicine

## 2022-07-08 ENCOUNTER — Ambulatory Visit: Payer: Medicare Other | Attending: Internal Medicine | Admitting: Internal Medicine

## 2022-07-08 VITALS — BP 154/88 | HR 89 | Ht 61.0 in | Wt 223.4 lb

## 2022-07-08 DIAGNOSIS — R0789 Other chest pain: Secondary | ICD-10-CM

## 2022-07-08 DIAGNOSIS — E785 Hyperlipidemia, unspecified: Secondary | ICD-10-CM | POA: Diagnosis not present

## 2022-07-08 DIAGNOSIS — M7989 Other specified soft tissue disorders: Secondary | ICD-10-CM | POA: Insufficient documentation

## 2022-07-08 DIAGNOSIS — I10 Essential (primary) hypertension: Secondary | ICD-10-CM

## 2022-07-08 DIAGNOSIS — I5033 Acute on chronic diastolic (congestive) heart failure: Secondary | ICD-10-CM

## 2022-07-08 DIAGNOSIS — I200 Unstable angina: Secondary | ICD-10-CM

## 2022-07-08 LAB — BASIC METABOLIC PANEL
Anion gap: 8 (ref 5–15)
BUN: 22 mg/dL (ref 8–23)
CO2: 25 mmol/L (ref 22–32)
Calcium: 9.2 mg/dL (ref 8.9–10.3)
Chloride: 105 mmol/L (ref 98–111)
Creatinine, Ser: 0.93 mg/dL (ref 0.44–1.00)
GFR, Estimated: >60 mL/min (ref >60)
Glucose, Bld: 128 mg/dL — ABNORMAL HIGH (ref 70–99)
Potassium: 3.7 mmol/L (ref 3.5–5.1)
Sodium: 138 mmol/L (ref 135–145)

## 2022-07-08 MED ORDER — METOPROLOL TARTRATE 25 MG PO TABS: 25.0000 mg | ORAL_TABLET | Freq: Two times a day (BID) | ORAL | 3 refills | Status: DC

## 2022-07-08 MED ORDER — FUROSEMIDE 20 MG PO TABS: 20.0000 mg | ORAL_TABLET | Freq: Every day | ORAL | 3 refills | Status: DC

## 2022-07-08 NOTE — Patient Instructions (Addendum)
Medication Instructions:  Your physician recommends the following medication changes.  START TAKING: Lasix 20 mg by mouth daily  INCREASE: Metoprolol to 25 mg by mouth twice daily   *If you need a refill on your cardiac medications before your next appointment, please call your pharmacy*   Lab Work: Your provider would like for you to have following labs drawn: (BMP).   Please go to the [facility] entrance and check in at the front desk.  You do not need an appointment.  They are open from 7am-6 pm.   If you have labs (blood work) drawn today and your tests are completely normal, you will receive your results only by: [place] (if you have MyChart) OR A paper copy in the mail If you have any lab test that is abnormal or we need to change your treatment, we will call you to review the results.   Testing/Procedures: None ordered today   Follow-Up: At [facility], you and your health needs are our priority.  As part of our continuing mission to provide you with exceptional heart care, we have created designated Provider Care Teams.  These Care Teams include your primary Cardiologist (physician) and Advanced Practice Providers (APPs -  Physician Assistants and Nurse Practitioners) who all work together to provide you with the care you need, when you need it.  We recommend signing up for the patient portal called "MyChart".  Sign up information is provided on this After Visit Summary.  MyChart is used to connect with patients for Virtual Visits (Telemedicine).  Patients are able to view lab/test results, encounter notes, upcoming appointments, etc.  Non-urgent messages can be sent to your provider as well.   To learn more about what you can do with MyChart, go to [email or web address].    Your next appointment:   1 month(s)  The format for your next appointment:   In Person  Provider:   You may see Nelva Bush, MD or one of the following Advanced  Practice Providers on your designated Care Team:   Murray Hodgkins, NP Christell Faith, PA-C Cadence Kathlen Mody, PA-C Gerrie Nordmann, NP

## 2022-07-08 NOTE — Progress Notes (Unsigned)
Follow-up Outpatient Visit Date: 07/08/2022  Primary Care Provider: Ria Bush, [place] [address]  Chief Complaint: Chest pain and shortness of breath  HPI:  Stephanie Mullen is a 67 y.o. female with history of with history of hypertension and thrombocytopenia, who presents for follow-up of chest pain.  I met her on 06/18/2022 for evaluation of progressive chest pain concerning for accelerating angina.  She underwent cardiac catheterization the next day with Dr. Fletcher Anon, which showed mild-moderate, nonobstructive CAD.  Her chest pain was felt to be noncardiac in nature.  Today, Stephanie Mullen reports that she is feeling somewhat better though she continues to have some intermittent pressure/tightness in her chest.  It is quite a bit better than at our last visit.  She still has intermittent headaches though her prior jaw pain has gotten better.  She has chronic exertional dyspnea.  She has noticed some leg swelling that began since her catheterization last month.  Only medication change was addition of metoprolol at our last visit.  She is not on a diuretic.  --------------------------------------------------------------------------------------------------  Cardiovascular History & Procedures: Cardiovascular Problems: Chest pain with nonobstructive coronary artery disease by catheterization   Risk Factors: Nonobstructive coronary artery disease, hypertension, and obesity   Cath/PCI: LHC (06/19/2022): LMCA normal.  LAD with minimal luminal irregularities.  D1 with 40% proximal stenosis.  LCx normal.  RCA with minimal luminal irregularities.  RPDA with 30% ostial stenosis.  Mildly elevated LVEDP.  LVEF 55-65%. LHC (12/01/2013, [facility]): LMCA normal. LAD with 20% mid vessel stenosis. Ramus normal. LCx normal. RCA normal with 30% stenosis involving rPDA.    CV Surgery: None   EP Procedures and Devices: None   Non-Invasive Evaluation(s): TTE  (09/09/2018): Normal LV size with mild LVH.  LVEF 60-65% with grade 1 diastolic dysfunction.  Normal RV size and function.  Normal biatrial size.  Mild mitral and tricuspid regurgitation.  Normal CVP.   Recent CV Pertinent Labs: Lab Results  Component Value Date   CHOL 173 04/24/2022   HDL 46.70 04/24/2022   LDLCALC 103 (H) 04/24/2022   TRIG 116.0 04/24/2022   CHOLHDL 4 04/24/2022   K 3.8 06/18/2022   BUN 17 06/18/2022   BUN 14 05/03/2018   CREATININE 0.90 06/18/2022    Past medical and surgical history were reviewed and updated in EPIC.  Current Meds  Medication Sig   acetaminophen (TYLENOL) 325 MG tablet Take 650 mg by mouth every 6 (six) hours as needed.   amLODipine (NORVASC) 5 MG tablet Take one tablet by mouth daily   aspirin EC 81 MG tablet Take 1 tablet (81 mg total) by mouth daily. Swallow whole.   atorvastatin (LIPITOR) 20 MG tablet Take 1 tablet (20 mg total) by mouth daily.   Cholecalciferol (VITAMIN D3) 25 MCG (1000 UT) CAPS Take 1 capsule (1,000 Units total) by mouth daily.   esomeprazole (NEXIUM) 40 MG capsule Take 1 capsule by mouth daily in the morning   metoprolol tartrate (LOPRESSOR) 25 MG tablet Take 0.5 tablets (12.5 mg total) by mouth 2 (two) times daily.   nitroGLYCERIN (NITROSTAT) 0.4 MG SL tablet Place 1 tablet (0.4 mg total) under the tongue every 5 (five) minutes as needed for chest pain. Up to 3 doses   triamcinolone (NASACORT) 55 MCG/ACT AERO nasal inhaler Place 2 sprays into the nose daily as needed.    Allergies: Amoxicillin-pot clavulanate and Celebrex [celecoxib]  Social History   Tobacco Use   Smoking status: Never  Passive exposure: Past   Smokeless tobacco: Never  Vaping Use   Vaping Use: Never used  Substance Use Topics   Alcohol use: Yes    Comment: 1-2 times a year, 1 drink   Drug use: Never    Family History  Problem Relation Age of Onset   Asthma Mother    Diabetes Mother    Allergies Father    Heart disease Father 74        has several blockages   Cystic fibrosis Sister    Obesity Brother        morbid   Sleep apnea Brother    Rheum arthritis Paternal Grandfather     Review of Systems: A 12-system review of systems was performed and was negative except as noted in the HPI.  --------------------------------------------------------------------------------------------------  Physical Exam: BP (!) 140/84 (BP Location: Left Arm, Patient Position: Sitting, Cuff Size: Large)   Pulse 89   Ht 5\' 1"  (1.549 m)   Wt 223 lb 6.4 oz (101.3 kg)   SpO2 95%   BMI 42.21 kg/m  Repeat BP: 145/88  General:  NAD. Neck: No JVD or HJR. Lungs: Clear to auscultation bilaterally without wheezes or crackles. Heart: Regular rate and rhythm without murmurs, rubs, or gallops. Abdomen: Soft, nontender, nondistended. Extremities: 1+ nonpitting edema in both legs.  Right radial arteriotomy site well-healed with 2+ pulse.  EKG: Normal sinus rhythm with left atrial enlargement.  Compared to prior tracing from 06/18/2022, criteria for anterior infarct are no longer present.  Otherwise, no significant interval change.  Lab Results  Component Value Date   WBC 11.8 (H) 06/18/2022   HGB 13.0 06/18/2022   HCT 41.3 06/18/2022   MCV 88.6 06/18/2022   PLT 232 06/18/2022    Lab Results  Component Value Date   NA 140 06/18/2022   K 3.8 06/18/2022   CL 106 06/18/2022   CO2 26 06/18/2022   BUN 17 06/18/2022   CREATININE 0.90 06/18/2022   GLUCOSE 126 (H) 06/18/2022   ALT 13 04/24/2022    Lab Results  Component Value Date   CHOL 173 04/24/2022   HDL 46.70 04/24/2022   LDLCALC 103 (H) 04/24/2022   TRIG 116.0 04/24/2022   CHOLHDL 4 04/24/2022    --------------------------------------------------------------------------------------------------  ASSESSMENT AND PLAN: Atypical chest pain: Ms. 04/26/2022 chest pain has improved since our last visit.  Her cardiac catheterization last month was reassuring, demonstrating only  mild-moderate disease involving D1 and RPDA.  The large epicardial coronary arteries appeared normal.  I wonder if her continued chest pain and chronic exertional dyspnea are noncardiac in nature or a result of microvascular disease.  Her uncontrolled blood pressure may also be playing a role.  We have agreed to increase metoprolol to tartrate to 25 mg twice daily.  Continue atorvastatin 20 mg daily to target an LDL less than 70 to prevent progression of CAD.  Acute on chronic HFpEF: Chronic exertional dyspnea is likely multifactorial but may be in part be due to component of diastolic heart failure.  Echo in 2020 showed grade 1 diastolic dysfunction.  LVEF was normal at the time of recent catheterization with mildly elevated LVEDP.  Given concerns of increased leg edema since catheterization, I will check a BMP today and also start furosemide 20 mg daily.  Plan to repeat BMP at follow-up visit in 1 month.  Hypertension: Blood pressure remains suboptimally controlled today.  As above, we will increase metoprolol to tartrate to 25 mg daily for blood pressure control and  antianginal therapy.  We will also add furosemide to help with edema and blood pressure.  Hyperlipidemia: LDL slightly elevated on last check in 03/2022.  Patient is on atorvastatin 20 mg daily, which should be continued for target LDL less than 70.  We discussed role for omega-3 fatty acids but have agreed to defer this given normal triglycerides and the limited clinical benefit as well as potential risk for precipitating atrial fibrillation.  Consider repeat lipid panel at follow-up visit.  Morbid obesity: BMI remains greater than 40.  Encourage weight loss through diet and exercise as tolerated.  Follow-up: Return to clinic in 1 month.  Nelva Bush, MD 07/08/2022 2:48 PM

## 2022-07-09 ENCOUNTER — Encounter: Payer: Self-pay | Admitting: Internal Medicine

## 2022-07-09 DIAGNOSIS — I5032 Chronic diastolic (congestive) heart failure: Secondary | ICD-10-CM | POA: Insufficient documentation

## 2022-07-09 DIAGNOSIS — I5033 Acute on chronic diastolic (congestive) heart failure: Secondary | ICD-10-CM | POA: Insufficient documentation

## 2022-07-27 ENCOUNTER — Encounter: Payer: Self-pay | Admitting: Family Medicine

## 2022-07-27 ENCOUNTER — Ambulatory Visit (INDEPENDENT_AMBULATORY_CARE_PROVIDER_SITE_OTHER): Payer: Medicare Other | Admitting: Family Medicine

## 2022-07-27 VITALS — BP 150/72 | HR 71 | Temp 97.7°F | Ht 61.0 in | Wt 221.5 lb

## 2022-07-27 DIAGNOSIS — K219 Gastro-esophageal reflux disease without esophagitis: Secondary | ICD-10-CM | POA: Diagnosis not present

## 2022-07-27 DIAGNOSIS — I2510 Atherosclerotic heart disease of native coronary artery without angina pectoris: Secondary | ICD-10-CM

## 2022-07-27 DIAGNOSIS — I5032 Chronic diastolic (congestive) heart failure: Secondary | ICD-10-CM | POA: Diagnosis not present

## 2022-07-27 DIAGNOSIS — I10 Essential (primary) hypertension: Secondary | ICD-10-CM | POA: Diagnosis not present

## 2022-07-27 MED ORDER — LOSARTAN POTASSIUM 50 MG PO TABS: 50.0000 mg | ORAL_TABLET | Freq: Every day | ORAL | 6 refills | Status: DC

## 2022-07-27 NOTE — Progress Notes (Signed)
Patient ID: Stephanie Mullen, female    DOB: Jun 16, 1956, 67 y.o.   MRN: [id number]  This visit was conducted in person.  BP (!) 150/72   Pulse 71   Temp 97.7 F (36.5 C) (Temporal)   Ht 5\' 1"  (1.549 m)   Wt 221 lb 8 oz (100.5 kg)   SpO2 96%   BMI 41.85 kg/m   BP Readings from Last 3 Encounters:  07/27/22 (!) 150/72  07/08/22 (!) 154/88  06/19/22 (!) 151/86    CC: 3 mo f/u visit  Subjective:   HPI: Stephanie Mullen is a 67 y.o. female presenting on 07/27/2022 for Medical Management of Chronic Issues (Here for 3 mo f/u. Pt brought in home BP monitor to compare. Reading in office today- 157/82.)   Established care with me last visit 03/2022. Referred to cardiology for chest discomfort associated with exertional dyspnea. She established with Dr End and had catheterization for accelerating angina which showed mild-mod nonobstructive CAD, chest discomfort thought to be noncardiac in nature. Metoprolol was increased to 25mg  bid. She was started on furosemide 20mg  daily for leg swelling noted. LDL goal <70 on atorvastatin 20mg .   HTN - Compliant with current antihypertensive regimen of amlodipine 5mg  daily, furosemide 20mg  daily (she's taking 1/2 tab at a time), metoprolol 25mg  bid.  Does check blood pressures at home and brings log - averaging 150s/80s.  No low blood pressure readings or symptoms of dizziness/syncope.  Denies HA, vision changes, CP/tightness.  Notes ongoing leg swelling and dyspnea. Notes some improvement in exertional dyspnea.   Notes worsening reflux over the past 2 weeks, burning sensation in throat, persistent despite nexium 40mg  daily. Wonders if the aspirin 81mg  is causing worsening symptoms. This psat week has had worsening epigastric stomach discomfort, now sleeping with elevated pillow/head due to worsening GERD symptoms, occ nausea. A few times has noted difficulty swallowing water, not solids or pills. No vomiting, early satiety, weight loss. Was previously on  Aciphex.   Did have episode of left thoracic back pain that lasted about a day.  Notes increased fatigue.  She's not recently been exercising/pool.      Relevant past medical, surgical, family and social history reviewed and updated as indicated. Interim medical history since our last visit reviewed. Allergies and medications reviewed and updated. Outpatient Medications Prior to Visit  Medication Sig Dispense Refill   acetaminophen (TYLENOL) 500 MG tablet Take 1 tablet (500 mg total) by mouth 2 (two) times daily as needed for moderate pain.     aspirin EC 81 MG tablet Take 1 tablet (81 mg total) by mouth daily. Swallow whole. 90 tablet 3   atorvastatin (LIPITOR) 20 MG tablet Take 1 tablet (20 mg total) by mouth daily. 90 tablet 3   Cholecalciferol (VITAMIN D3) 25 MCG (1000 UT) CAPS Take 1 capsule (1,000 Units total) by mouth daily. 30 capsule    esomeprazole (NEXIUM) 40 MG capsule Take 1 capsule by mouth daily in the morning 30 capsule 5   furosemide (LASIX) 20 MG tablet Take 1 tablet (20 mg total) by mouth daily. 90 tablet 3   metoprolol tartrate (LOPRESSOR) 25 MG tablet Take 1 tablet (25 mg total) by mouth 2 (two) times daily. 180 tablet 3   nitroGLYCERIN (NITROSTAT) 0.4 MG SL tablet Place 1 tablet (0.4 mg total) under the tongue every 5 (five) minutes as needed for chest pain. Up to 3 doses 25 tablet 3   triamcinolone (NASACORT) 55 MCG/ACT AERO nasal inhaler Place  2 sprays into the nose daily as needed.     acetaminophen (TYLENOL) 325 MG tablet Take 650 mg by mouth every 6 (six) hours as needed.     amLODipine (NORVASC) 5 MG tablet Take one tablet by mouth daily 90 tablet 3   No facility-administered medications prior to visit.     Per HPI unless specifically indicated in ROS section below Review of Systems  Objective:  BP (!) 150/72   Pulse 71   Temp 97.7 F (36.5 C) (Temporal)   Ht 5\' 1"  (1.549 m)   Wt 221 lb 8 oz (100.5 kg)   SpO2 96%   BMI 41.85 kg/m   Wt Readings from  Last 3 Encounters:  07/27/22 221 lb 8 oz (100.5 kg)  07/08/22 223 lb 6.4 oz (101.3 kg)  06/19/22 224 lb (101.6 kg)      Physical Exam Vitals and nursing note reviewed.  Constitutional:      Appearance: Normal appearance. She is obese. She is not ill-appearing.  HENT:     Head: Normocephalic and atraumatic.     Mouth/Throat:     Mouth: Mucous membranes are moist.     Pharynx: Oropharynx is clear. No oropharyngeal exudate or posterior oropharyngeal erythema.  Eyes:     Extraocular Movements: Extraocular movements intact.     Conjunctiva/sclera: Conjunctivae normal.     Pupils: Pupils are equal, round, and reactive to light.  Cardiovascular:     Rate and Rhythm: Normal rate and regular rhythm.     Pulses: Normal pulses.     Heart sounds: Normal heart sounds. No murmur heard. Pulmonary:     Effort: Pulmonary effort is normal. No respiratory distress.     Breath sounds: Normal breath sounds. No wheezing, rhonchi or rales.  Musculoskeletal:        General: Tenderness (LE) present.     Right lower leg: No edema.     Left lower leg: No edema.  Skin:    General: Skin is warm and dry.     Findings: No rash.  Neurological:     Mental Status: She is alert.  Psychiatric:        Mood and Affect: Mood normal.        Behavior: Behavior normal.       Results for orders placed or performed during the hospital encounter of 60/73/71  Basic metabolic panel  Result Value Ref Range   Sodium 138 135 - 145 mmol/L   Potassium 3.7 3.5 - 5.1 mmol/L   Chloride 105 98 - 111 mmol/L   CO2 25 22 - 32 mmol/L   Glucose, Bld 128 (H) 70 - 99 mg/dL   BUN 22 8 - 23 mg/dL   Creatinine, Ser 0.93 0.44 - 1.00 mg/dL   Calcium 9.2 8.9 - 10.3 mg/dL   GFR, Estimated >60 >60 mL/min   Anion gap 8 5 - 15   Lab Results  Component Value Date   WBC 11.8 (H) 06/18/2022   HGB 13.0 06/18/2022   HCT 41.3 06/18/2022   MCV 88.6 06/18/2022   PLT 232 06/18/2022    Assessment & Plan:   Problem List Items  Addressed This Visit     CAD in native artery    Appreciate cardiology care.       Relevant Medications   losartan (COZAAR) 50 MG tablet   Essential hypertension - Primary    Chronic, remaining elevated.  Change amlodipine 5mg  to losartan 50mg  daily, check labs in 1 wk.  Relevant Medications   losartan (COZAAR) 50 MG tablet   Other Relevant Orders   Basic metabolic panel   CBC with Differential/Platelet   GERD (gastroesophageal reflux disease)    Worsening symptoms noted despite daily nexium 40mg .  Stop amlodipine to see if contributing - change to losartan 50mg  daily.  Add pepcid 20mg  nightly. Reviewed GERD precautions as per instructions. Update if ongoing to consider stronger PPI Dexilant       Chronic heart failure with preserved ejection fraction (HFpEF) (HCC)    Change amlodipine to losartan.  See above.       Relevant Medications   losartan (COZAAR) 50 MG tablet     Meds ordered this encounter  Medications   losartan (COZAAR) 50 MG tablet    Sig: Take 1 tablet (50 mg total) by mouth daily.    Dispense:  30 tablet    Refill:  6    To replace amlodipine    Orders Placed This Encounter  Procedures   Basic metabolic panel    Standing Status:   Future    Standing Expiration Date:   07/28/2023   CBC with Differential/Platelet    Standing Status:   Future    Standing Expiration Date:   07/28/2023    Patient Instructions  Stop amlodipine. Start in its place losartan 50mg  daily - sent to pharmacy. Come in next Monday for labwork only.  Return in 3 months for follow up visit   Add pepcid (famotidine) 20mg  over the counter nightly.  Head of bed elevated. Avoidance of citrus, fatty foods, chocolate, peppermint, and excessive alcohol, along with sodas, orange juice (acidic drinks) At least a few hours between dinner and bed, minimize naps after eating.   Follow up plan: Return in about 3 months (around 10/26/2022), or if symptoms worsen or fail to improve,  for follow up visit.  Ria Bush, MD

## 2022-07-27 NOTE — Assessment & Plan Note (Signed)
Appreciate cardiology care.  °

## 2022-07-27 NOTE — Assessment & Plan Note (Signed)
Chronic, remaining elevated.  Change amlodipine 5mg  to losartan 50mg  daily, check labs in 1 wk.

## 2022-07-27 NOTE — Assessment & Plan Note (Addendum)
Worsening symptoms noted despite daily nexium 40mg .  Stop amlodipine to see if contributing - change to losartan 50mg  daily.  Add pepcid 20mg  nightly. Reviewed GERD precautions as per instructions. Update if ongoing to consider stronger PPI Dexilant

## 2022-07-27 NOTE — Assessment & Plan Note (Signed)
Change amlodipine to losartan.  See above.

## 2022-07-27 NOTE — Patient Instructions (Addendum)
Stop amlodipine. Start in its place losartan 50mg  daily - sent to pharmacy. Come in next Monday for labwork only.  Return in 3 months for follow up visit   Add pepcid (famotidine) 20mg  over the counter nightly.  Head of bed elevated. Avoidance of citrus, fatty foods, chocolate, peppermint, and excessive alcohol, along with sodas, orange juice (acidic drinks) At least a few hours between dinner and bed, minimize naps after eating.

## 2022-08-03 ENCOUNTER — Other Ambulatory Visit (INDEPENDENT_AMBULATORY_CARE_PROVIDER_SITE_OTHER): Payer: Medicare Other

## 2022-08-03 DIAGNOSIS — I10 Essential (primary) hypertension: Secondary | ICD-10-CM

## 2022-08-03 LAB — BASIC METABOLIC PANEL
BUN: 18 mg/dL (ref 6–23)
CO2: 28 mEq/L (ref 19–32)
Calcium: 9.4 mg/dL (ref 8.4–10.5)
Chloride: 105 mEq/L (ref 96–112)
Creatinine, Ser: 0.91 mg/dL (ref 0.40–1.20)
GFR: 64.97 mL/min (ref >60.00)
Glucose, Bld: 101 mg/dL — ABNORMAL HIGH (ref 70–99)
Potassium: 3.7 mEq/L (ref 3.5–5.1)
Sodium: 139 mEq/L (ref 135–145)

## 2022-08-03 LAB — CBC WITH DIFFERENTIAL/PLATELET
Basophils Absolute: 0.1 10*3/uL (ref 0.0–0.1)
Basophils Relative: 0.7 % (ref 0.0–3.0)
Eosinophils Absolute: 0.6 10*3/uL (ref 0.0–0.7)
Eosinophils Relative: 6.1 % — ABNORMAL HIGH (ref 0.0–5.0)
HCT: 40.1 % (ref 36.0–46.0)
Hemoglobin: 13.4 g/dL (ref 12.0–15.0)
Lymphocytes Relative: 33.5 % (ref 12.0–46.0)
Lymphs Abs: 3 10*3/uL (ref 0.7–4.0)
MCHC: 33.4 g/dL (ref 30.0–36.0)
MCV: 85.9 fl (ref 78.0–100.0)
Monocytes Absolute: 0.5 10*3/uL (ref 0.1–1.0)
Monocytes Relative: 6 % (ref 3.0–12.0)
Neutro Abs: 4.9 10*3/uL (ref 1.4–7.7)
Neutrophils Relative %: 53.7 % (ref 43.0–77.0)
Platelets: 185 10*3/uL (ref 150.0–400.0)
RBC: 4.67 Mil/uL (ref 3.87–5.11)
RDW: 14.1 % (ref 11.5–15.5)
WBC: 9.1 10*3/uL (ref 4.0–10.5)

## 2022-08-07 ENCOUNTER — Encounter: Payer: Self-pay | Admitting: Internal Medicine

## 2022-08-07 ENCOUNTER — Ambulatory Visit: Payer: Medicare Other | Admitting: Physician Assistant

## 2022-08-07 ENCOUNTER — Ambulatory Visit: Payer: Medicare Other | Attending: Physician Assistant | Admitting: Internal Medicine

## 2022-08-07 ENCOUNTER — Ambulatory Visit: Payer: Medicare Other | Admitting: Internal Medicine

## 2022-08-07 VITALS — BP 124/78 | HR 59 | Ht 61.0 in | Wt 224.1 lb

## 2022-08-07 DIAGNOSIS — Z79899 Other long term (current) drug therapy: Secondary | ICD-10-CM | POA: Insufficient documentation

## 2022-08-07 DIAGNOSIS — I5032 Chronic diastolic (congestive) heart failure: Secondary | ICD-10-CM | POA: Insufficient documentation

## 2022-08-07 DIAGNOSIS — I2510 Atherosclerotic heart disease of native coronary artery without angina pectoris: Secondary | ICD-10-CM

## 2022-08-07 DIAGNOSIS — E785 Hyperlipidemia, unspecified: Secondary | ICD-10-CM | POA: Insufficient documentation

## 2022-08-07 DIAGNOSIS — R0789 Other chest pain: Secondary | ICD-10-CM | POA: Insufficient documentation

## 2022-08-07 DIAGNOSIS — I10 Essential (primary) hypertension: Secondary | ICD-10-CM | POA: Insufficient documentation

## 2022-08-07 MED ORDER — METOPROLOL TARTRATE 25 MG PO TABS: 25.0000 mg | ORAL_TABLET | Freq: Two times a day (BID) | ORAL | 3 refills | Status: DC

## 2022-08-07 NOTE — Patient Instructions (Signed)
Medication Instructions:  Your Physician recommend you continue on your current medication as directed.    *If you need a refill on your cardiac medications before your next appointment, please call your pharmacy*   Lab Work: None ordered today   Testing/Procedures: None ordered today   Follow-Up: At [facility], you and your health needs are our priority.  As part of our continuing mission to provide you with exceptional heart care, we have created designated Provider Care Teams.  These Care Teams include your primary Cardiologist (physician) and Advanced Practice Providers (APPs -  Physician Assistants and Nurse Practitioners) who all work together to provide you with the care you need, when you need it.  We recommend signing up for the patient portal called "MyChart".  Sign up information is provided on this After Visit Summary.  MyChart is used to connect with patients for Virtual Visits (Telemedicine).  Patients are able to view lab/test results, encounter notes, upcoming appointments, etc.  Non-urgent messages can be sent to your provider as well.   To learn more about what you can do with MyChart, go to [email or web address].    Your next appointment:   6 month(s)  Provider:   You may see Nelva Bush, MD or one of the following Advanced Practice Providers on your designated Care Team:   Murray Hodgkins, NP Christell Faith, PA-C Cadence Kathlen Mody, PA-C Gerrie Nordmann, NP

## 2022-08-07 NOTE — Progress Notes (Unsigned)
Follow-up Outpatient Visit Date: 08/07/2022  Primary Care Provider: Ria Bush, [place] [address]  Chief Complaint: Follow-up chest pain and swelling  HPI:  Stephanie Mullen is a 67 y.o. female with history of hypertension and thrombocytopenia, who presents for follow-up of chest pain.  I last saw her a month ago, at which time she complained of intermittent chest discomfort and chronic exertional dyspnea.  She also noted some leg swelling, prompting [place] to add low-dose furosemide.  We also increased metoprolol tartrate to 25 mg twice daily.  Today, Stephanie Mullen reports that she is feeling better.  She was having stomach issues after our last visit and was advised to begin taking famotidine and esomeprazole by Dr. Danise Mina.  She has been feeling much better.  She was also started on losartan for BP control and is tolerating this well.  She reports taking a single dose of nitroglycerin about 3 weeks ago because she was "feeling bad all over" and reports that is offered some relief.  Breathing is swelling.  Edema has been minimal with addition of furosemide.  --------------------------------------------------------------------------------------------------  Past Medical History:  Diagnosis Date   Arthritis    Asthma    Coronary artery disease    GERD (gastroesophageal reflux disease)    Hypertension    Past Surgical History:  Procedure Laterality Date   CARDIAC CATHETERIZATION  2015   nonobstructive CAD, nromal LV function (Kierah Goatley)   COLONOSCOPY  06/2008   WNL, rpt 10 yrs Gustavo Lah)   LEFT HEART CATH AND CORONARY ANGIOGRAPHY Left 06/19/2022   Procedure: LEFT HEART CATH AND CORONARY ANGIOGRAPHY;  Surgeon: Wellington Hampshire, MD;  Location: [facility];  Service: Cardiovascular;  Laterality: Left;   LUMBAR DISC SURGERY  2003   R L4/5 hemilaminectomy and discectomy 2003 for L4/5 HNP   TONSILLECTOMY      Current Meds  Medication Sig    acetaminophen (TYLENOL) 500 MG tablet Take 1 tablet (500 mg total) by mouth 2 (two) times daily as needed for moderate pain.   aspirin EC 81 MG tablet Take 1 tablet (81 mg total) by mouth daily. Swallow whole.   atorvastatin (LIPITOR) 20 MG tablet Take 1 tablet (20 mg total) by mouth daily.   Cholecalciferol (VITAMIN D3) 25 MCG (1000 UT) CAPS Take 1 capsule (1,000 Units total) by mouth daily.   esomeprazole (NEXIUM) 40 MG capsule Take 1 capsule by mouth daily in the morning   famotidine (PEPCID) 20 MG tablet Take 20 mg by mouth at bedtime.   furosemide (LASIX) 20 MG tablet Take 1 tablet (20 mg total) by mouth daily.   losartan (COZAAR) 50 MG tablet Take 1 tablet (50 mg total) by mouth daily.   metoprolol tartrate (LOPRESSOR) 25 MG tablet Take 25 mg by mouth 2 (two) times daily.   nitroGLYCERIN (NITROSTAT) 0.4 MG SL tablet Place 1 tablet (0.4 mg total) under the tongue every 5 (five) minutes as needed for chest pain. Up to 3 doses   triamcinolone (NASACORT) 55 MCG/ACT AERO nasal inhaler Place 2 sprays into the nose daily as needed.   [DISCONTINUED] metoprolol tartrate (LOPRESSOR) 25 MG tablet Take 1 tablet (25 mg total) by mouth 2 (two) times daily. (Patient taking differently: Take 50 mg by mouth 2 (two) times daily.)    Allergies: Amoxicillin-pot clavulanate and Celebrex [celecoxib]  Social History   Tobacco Use   Smoking status: Never    Passive exposure: Past   Smokeless tobacco: Never  Vaping  Use   Vaping Use: Never used  Substance Use Topics   Alcohol use: Yes    Comment: 1-2 times a year, 1 drink   Drug use: Never    Family History  Problem Relation Age of Onset   Asthma Mother    Diabetes Mother    Allergies Father    Heart disease Father 83       has several blockages   Cystic fibrosis Sister    Obesity Brother        morbid   Sleep apnea Brother    Rheum arthritis Paternal Grandfather     Review of Systems: A 12-system review of systems was performed and was  negative except as noted in the HPI.  --------------------------------------------------------------------------------------------------  Physical Exam: BP 124/78 (BP Location: Left Arm, Patient Position: Sitting, Cuff Size: Large)   Pulse (!) 59   Ht 5' 1"$  (1.549 m)   Wt 224 lb 2 oz (101.7 kg)   SpO2 98%   BMI 42.35 kg/m   General:  NAD. Neck: No JVD or HJR. Lungs: Clear to auscultation bilaterally without wheezes or crackles. Heart: Regular rate and rhythm without murmurs, rubs, or gallops. Abdomen: Soft, nontender, nondistended. Extremities: No lower extremity edema.  Lab Results  Component Value Date   WBC 9.1 08/03/2022   HGB 13.4 08/03/2022   HCT 40.1 08/03/2022   MCV 85.9 08/03/2022   PLT 185.0 08/03/2022    Lab Results  Component Value Date   NA 139 08/03/2022   K 3.7 08/03/2022   CL 105 08/03/2022   CO2 28 08/03/2022   BUN 18 08/03/2022   CREATININE 0.91 08/03/2022   GLUCOSE 101 (H) 08/03/2022   ALT 13 04/24/2022    Lab Results  Component Value Date   CHOL 173 04/24/2022   HDL 46.70 04/24/2022   LDLCALC 103 (H) 04/24/2022   TRIG 116.0 04/24/2022   CHOLHDL 4 04/24/2022    --------------------------------------------------------------------------------------------------  ASSESSMENT AND PLAN: Atypical chest pain: Significantly improved with addition of famotidine and esomeprazole by Dr. Danise Mina, suggesting underlying GI etiology.  Continue current medical therapy to prevent progression of non-obstructive CAD seen on cath in 05/2022.  Chronic HFpEF: Stephanie Mullen appears grossly euvolemic and is feeling better with addition of furosemide 20 mg daily at our last visit.  Recent BMP drawn by Dr. Danise Mina shows normal renal function and potassium.  Hypertension: BP at goal today.  Continue current medications.  Hyperlipidemia: Continue atorvastatin 20 mg daily with repeat fasting lipid panel and ALT at Stephanie Mullen's convenience.  We will need to  consider titration of atorvastatin if LDL remains > 70.  Morbid obesity: BMI remains > 40.  Weight loss encouraged through diet and exercise.  Follow-up: Return to clinic in 6 months.  Nelva Bush, MD 08/07/2022 4:42 PM

## 2022-08-08 ENCOUNTER — Encounter: Payer: Self-pay | Admitting: Internal Medicine

## 2022-08-12 ENCOUNTER — Telehealth: Payer: Self-pay

## 2022-08-12 NOTE — Telephone Encounter (Signed)
Attempted to contact pt to inform Dr. Saunders Revel would like her to complete a lipid panel and ALT at her convenience. Left message to call back.

## 2022-08-13 NOTE — Telephone Encounter (Signed)
Pt made aware and verbalized understanding.

## 2022-08-14 ENCOUNTER — Other Ambulatory Visit: Payer: Self-pay | Admitting: *Deleted

## 2022-08-14 ENCOUNTER — Other Ambulatory Visit
Admission: RE | Admit: 2022-08-14 | Discharge: 2022-08-14 | Disposition: A | Discharge status: Home / Self Care | Payer: Medicare Other | Attending: Physician Assistant | Admitting: Physician Assistant

## 2022-08-14 DIAGNOSIS — Z79899 Other long term (current) drug therapy: Secondary | ICD-10-CM | POA: Insufficient documentation

## 2022-08-14 DIAGNOSIS — E785 Hyperlipidemia, unspecified: Secondary | ICD-10-CM

## 2022-08-14 DIAGNOSIS — I5032 Chronic diastolic (congestive) heart failure: Secondary | ICD-10-CM

## 2022-08-14 DIAGNOSIS — I10 Essential (primary) hypertension: Secondary | ICD-10-CM | POA: Insufficient documentation

## 2022-08-14 LAB — HEPATIC FUNCTION PANEL
ALT: 131 U/L — ABNORMAL HIGH (ref 0–44)
AST: 115 U/L — ABNORMAL HIGH (ref 15–41)
Albumin: 3.8 g/dL (ref 3.5–5.0)
Alkaline Phosphatase: 120 U/L (ref 38–126)
Bilirubin, Direct: 0.2 mg/dL (ref 0.0–0.2)
Indirect Bilirubin: 0.9 mg/dL (ref 0.3–0.9)
Total Bilirubin: 1.1 mg/dL (ref 0.3–1.2)
Total Protein: 7.5 g/dL (ref 6.5–8.1)

## 2022-08-14 LAB — LIPID PANEL
Cholesterol: 118 mg/dL (ref 0–200)
HDL: 49 mg/dL (ref >40)
LDL Cholesterol: 55 mg/dL (ref 0–99)
Total CHOL/HDL Ratio: 2.4 RATIO
Triglycerides: 70 mg/dL (ref <150)
VLDL: 14 mg/dL (ref 0–40)

## 2022-08-14 LAB — ALT: ALT: 134 U/L — ABNORMAL HIGH (ref 0–44)

## 2022-08-17 ENCOUNTER — Other Ambulatory Visit: Payer: Self-pay

## 2022-08-17 DIAGNOSIS — Z79899 Other long term (current) drug therapy: Secondary | ICD-10-CM

## 2022-09-11 ENCOUNTER — Telehealth: Payer: Self-pay

## 2022-09-11 NOTE — Telephone Encounter (Signed)
Called pt to remind Dr. Saunders Revel recommended repeating liver function. Pt verbalized understanding and stated she will have labs collected next week.

## 2022-09-18 ENCOUNTER — Other Ambulatory Visit
Admission: RE | Admit: 2022-09-18 | Discharge: 2022-09-18 | Disposition: A | Discharge status: Home / Self Care | Payer: Medicare Other | Attending: Internal Medicine | Admitting: Internal Medicine

## 2022-09-18 DIAGNOSIS — Z79899 Other long term (current) drug therapy: Secondary | ICD-10-CM | POA: Diagnosis present

## 2022-09-18 LAB — HEPATIC FUNCTION PANEL
ALT: 48 U/L — ABNORMAL HIGH (ref 0–44)
AST: 53 U/L — ABNORMAL HIGH (ref 15–41)
Albumin: 4.1 g/dL (ref 3.5–5.0)
Alkaline Phosphatase: 99 U/L (ref 38–126)
Bilirubin, Direct: 0.2 mg/dL (ref 0.0–0.2)
Indirect Bilirubin: 0.8 mg/dL (ref 0.3–0.9)
Total Bilirubin: 1 mg/dL (ref 0.3–1.2)
Total Protein: 7.8 g/dL (ref 6.5–8.1)

## 2022-10-26 ENCOUNTER — Ambulatory Visit (INDEPENDENT_AMBULATORY_CARE_PROVIDER_SITE_OTHER): Payer: Medicare Other | Admitting: Family Medicine

## 2022-10-26 ENCOUNTER — Encounter: Payer: Self-pay | Admitting: Family Medicine

## 2022-10-26 VITALS — BP 128/64 | HR 74 | Temp 97.5°F | Ht 61.0 in | Wt 212.0 lb

## 2022-10-26 DIAGNOSIS — E559 Vitamin D deficiency, unspecified: Secondary | ICD-10-CM | POA: Insufficient documentation

## 2022-10-26 DIAGNOSIS — R1011 Right upper quadrant pain: Secondary | ICD-10-CM | POA: Diagnosis not present

## 2022-10-26 DIAGNOSIS — E785 Hyperlipidemia, unspecified: Secondary | ICD-10-CM

## 2022-10-26 DIAGNOSIS — K219 Gastro-esophageal reflux disease without esophagitis: Secondary | ICD-10-CM

## 2022-10-26 DIAGNOSIS — R7401 Elevation of levels of liver transaminase levels: Secondary | ICD-10-CM | POA: Insufficient documentation

## 2022-10-26 DIAGNOSIS — I10 Essential (primary) hypertension: Secondary | ICD-10-CM

## 2022-10-26 NOTE — Assessment & Plan Note (Signed)
Did respond to nexium 40mg  + pepcid daily.  However decided to discontinue anti-reflux medication. Doing well controlling GERD symptoms with diet.

## 2022-10-26 NOTE — Patient Instructions (Addendum)
Repeat labs today.  We will call you to schedule liver/gallbladder ultrasound  Symptoms sound suspicious for gallstone.  Let us know if any worsening pain or other symptoms.  Return in 6 months for next wellness visit

## 2022-10-26 NOTE — Assessment & Plan Note (Signed)
Update levels on 1000 IU daily replacement since 03/2022.

## 2022-10-26 NOTE — Assessment & Plan Note (Signed)
Chronic, stable on current regimen - continue. 

## 2022-10-26 NOTE — Assessment & Plan Note (Signed)
Congratulated on 12 lb weight loss to date through healthy diet choices!

## 2022-10-26 NOTE — Assessment & Plan Note (Signed)
Atorvastatin may have caused transaminitis, abd discomfort - see below. Overall feeling better off statin, has even been able to stop all anti-reflux medication.  Transaminitis was improving off statin - will update again today.  See below - will further evaluate for possible gallstone contribution.

## 2022-10-26 NOTE — Assessment & Plan Note (Addendum)
Atorvastatin may have caused transaminitis, abd discomfort - see below. Will remain off statin at this time.

## 2022-10-26 NOTE — Progress Notes (Signed)
Ph: [phone]       Fax: [phone]   Patient ID: Stephanie Mullen, female    DOB: April 17, 1956, 67 y.o.   MRN: [id number]  This visit was conducted in person.  BP 128/64   Pulse 74   Temp (!) 97.5 F (36.4 C) (Temporal)   Ht 5\' 1"  (1.549 m)   Wt 212 lb (96.2 kg)   SpO2 97%   BMI 40.06 kg/m    CC: 3 mo f/u visit  Subjective:   HPI: Stephanie Mullen is a 67 y.o. female presenting on 10/26/2022 for Medical Management of Chronic Issues (Here for 3 mo f/u.)   See prior note for details.   Overall feeling well.   Recent transaminitis noted by cardiology - did improve with holding atorvastatin but remained mildly elevated.   Worsening GERD - last visit amlodipine was changed to losartan to see if heartburn symptoms would improve. She subsequently stopped both nexium 40mg  daily and pepcid 20mg  nightly. Instead is watching with diet - she's eating healthier increasing vegetables, hummus, beans. She ate fried chicken from [employer] along with pizza bowl over the weekend - with recurrent RUQ burning ache, mild LUQ discomfort as well, some crescendo/decrescendo component with gassiness/bloating, no nausea, vomiting, fever, jaundice. She does have gallbladder, no h/o gallstones.   HTN - compliant with current antihypertensive regimen of losartan 50mg  daily, metoprolol 25mg  bid. Does not check blood pressures at home.  No low blood pressure readings or symptoms of dizziness/syncope.  Denies HA, vision changes, CP/tightness, SOB, leg swelling.    Notes some wrist inflammation for several weeks. Using aspercream with lidocaine wth benefit. Denies inciting trauma/injury or fall. Also occ with L pes anserine bursitis.   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      Relevant past medical, surgical, family and social history reviewed and updated as indicated. Interim medical history since our last visit reviewed. Allergies and medications reviewed and updated. Outpatient Medications Prior to Visit   Medication Sig Dispense Refill   acetaminophen (TYLENOL) 500 MG tablet Take 1 tablet (500 mg total) by mouth 2 (two) times daily as needed for moderate pain.     aspirin EC 81 MG tablet Take 1 tablet (81 mg total) by mouth daily. Swallow whole. 90 tablet 3   Cholecalciferol (VITAMIN D3) 25 MCG (1000 UT) CAPS Take 1 capsule (1,000 Units total) by mouth daily. 30 capsule    Cyanocobalamin (VITAMIN B12 PO) Take by mouth daily.     furosemide (LASIX) 20 MG tablet Take 1 tablet (20 mg total) by mouth daily. (Patient taking differently: Take 20 mg by mouth daily. As needed) 90 tablet 3   losartan (COZAAR) 50 MG tablet Take 1 tablet (50 mg total) by mouth daily. 30 tablet 6   MAGNESIUM PO Take by mouth daily.     metoprolol tartrate (LOPRESSOR) 25 MG tablet Take 1 tablet (25 mg total) by mouth 2 (two) times daily. 180 tablet 3   nitroGLYCERIN (NITROSTAT) 0.4 MG SL tablet Place 1 tablet (0.4 mg total) under the tongue every 5 (five) minutes as needed for chest pain. Up to 3 doses 25 tablet 3   Pyridoxine HCl (VITAMIN B6 PO) Take by mouth daily.     triamcinolone (NASACORT) 55 MCG/ACT AERO nasal inhaler Place 2 sprays into the nose daily as needed.     atorvastatin (LIPITOR) 20 MG tablet Take 1 tablet (20 mg total) by mouth daily. 90 tablet 3   esomeprazole (NEXIUM) 40 MG capsule  Take 1 capsule by mouth daily in the morning 30 capsule 5   famotidine (PEPCID) 20 MG tablet Take 20 mg by mouth at bedtime.     No facility-administered medications prior to visit.     Per HPI unless specifically indicated in ROS section below Review of Systems  Objective:  BP 128/64   Pulse 74   Temp (!) 97.5 F (36.4 C) (Temporal)   Ht 5\' 1"  (1.549 m)   Wt 212 lb (96.2 kg)   SpO2 97%   BMI 40.06 kg/m   Wt Readings from Last 3 Encounters:  10/26/22 212 lb (96.2 kg)  08/07/22 224 lb 2 oz (101.7 kg)  07/27/22 221 lb 8 oz (100.5 kg)      Physical Exam Vitals and nursing note reviewed.  Constitutional:       Appearance: Normal appearance. She is not ill-appearing.  HENT:     Mouth/Throat:     Mouth: Mucous membranes are moist.     Pharynx: Oropharynx is clear. No oropharyngeal exudate or posterior oropharyngeal erythema.  Eyes:     Extraocular Movements: Extraocular movements intact.     Conjunctiva/sclera: Conjunctivae normal.     Pupils: Pupils are equal, round, and reactive to light.  Cardiovascular:     Rate and Rhythm: Normal rate and regular rhythm.     Pulses: Normal pulses.     Heart sounds: Normal heart sounds. No murmur heard. Pulmonary:     Effort: Pulmonary effort is normal. No respiratory distress.     Breath sounds: Normal breath sounds. No wheezing, rhonchi or rales.  Abdominal:     General: Bowel sounds are normal. There is no distension.     Palpations: Abdomen is soft. There is no hepatomegaly, splenomegaly or mass.     Tenderness: There is abdominal tenderness (mild) in the right upper quadrant, epigastric area and left upper quadrant. There is no right CVA tenderness, left CVA tenderness, guarding or rebound. Negative signs include Murphy's sign.     Hernia: No hernia is present.  Musculoskeletal:     Right lower leg: No edema.     Left lower leg: No edema.  Skin:    General: Skin is warm and dry.     Findings: No rash.  Neurological:     Mental Status: She is alert.  Psychiatric:        Mood and Affect: Mood normal.        Behavior: Behavior normal.       Results for orders placed or performed during the hospital encounter of 09/18/22  Hepatic function panel  Result Value Ref Range   Total Protein 7.8 6.5 - 8.1 g/dL   Albumin 4.1 3.5 - 5.0 g/dL   AST 53 (H) 15 - 41 U/L   ALT 48 (H) 0 - 44 U/L   Alkaline Phosphatase 99 38 - 126 U/L   Total Bilirubin 1.0 0.3 - 1.2 mg/dL   Bilirubin, Direct 0.2 0.0 - 0.2 mg/dL   Indirect Bilirubin 0.8 0.3 - 0.9 mg/dL   Lab Results  Component Value Date   CREATININE 0.91 08/03/2022   BUN 18 08/03/2022   NA 139  08/03/2022   K 3.7 08/03/2022   CL 105 08/03/2022   CO2 28 08/03/2022    Assessment & Plan:   Problem List Items Addressed This Visit     Obesity, morbid, BMI 40.0-49.9 (HCC)    Congratulated on 12 lb weight loss to date through healthy diet choices!  Essential hypertension    Chronic, stable on current regimen - continue.       Hyperlipidemia LDL goal <70    Atorvastatin may have caused transaminitis, abd discomfort - see below. Will remain off statin at this time.       GERD (gastroesophageal reflux disease)    Did respond to nexium 40mg  + pepcid daily.  However decided to discontinue anti-reflux medication. Doing well controlling GERD symptoms with diet.       RUQ abdominal pain - Primary    Intermittent RUQ abd discomfort, occasional epigastric and LUQ, description suspicious for biliary colic. Discussed this with patient. Check CBC, CMP, lipase, as well as RUQ abd [place] to eval for gallstones.       Relevant Orders   Comprehensive metabolic panel   US ABDOMEN LIMITED RUQ (LIVER/GB)   CBC with Differential/Platelet   Lipase   Vitamin D deficiency    Update levels on 1000 IU daily replacement since 03/2022.       Relevant Orders   VITAMIN D 25 Hydroxy (Vit-D Deficiency, Fractures)   Transaminitis    Atorvastatin may have caused transaminitis, abd discomfort - see below. Overall feeling better off statin, has even been able to stop all anti-reflux medication.  Transaminitis was improving off statin - will update again today.  See below - will further evaluate for possible gallstone contribution.        No orders of the defined types were placed in this encounter.   Orders Placed This Encounter  Procedures   US ABDOMEN LIMITED RUQ (LIVER/GB)    Standing Status:   Future    Standing Expiration Date:   10/26/2023    Order Specific Question:   Reason for Exam (SYMPTOM  OR DIAGNOSIS REQUIRED)    Answer:   RUQ abd pain consistent with biliary colic eval  gallstones    Order Specific Question:   Preferred imaging location?    Answer:   [facility]   Comprehensive metabolic panel   VITAMIN D 25 Hydroxy (Vit-D Deficiency, Fractures)   CBC with Differential/Platelet   Lipase    Patient Instructions  Repeat labs today.  We will call you to schedule liver/gallbladder ultrasound  Symptoms sound suspicious for gallstone.  Let us know if any worsening pain or other symptoms.  Return in 6 months for next wellness visit  Follow up plan: Return in about 6 months (around 04/27/2023) for medicare wellness visit.  Eustaquio Boyden, MD

## 2022-10-26 NOTE — Assessment & Plan Note (Signed)
Intermittent RUQ abd discomfort, occasional epigastric and LUQ, description suspicious for biliary colic. Discussed this with patient. Check CBC, CMP, lipase, as well as RUQ abd [place] to eval for gallstones.

## 2022-10-27 LAB — COMPREHENSIVE METABOLIC PANEL
ALT: 27 U/L (ref 0–35)
AST: 29 U/L (ref 0–37)
Albumin: 3.9 g/dL (ref 3.5–5.2)
Alkaline Phosphatase: 76 U/L (ref 39–117)
BUN: 20 mg/dL (ref 6–23)
CO2: 29 mEq/L (ref 19–32)
Calcium: 9.5 mg/dL (ref 8.4–10.5)
Chloride: 103 mEq/L (ref 96–112)
Creatinine, Ser: 0.84 mg/dL (ref 0.40–1.20)
GFR: 71.4 mL/min (ref >60.00)
Glucose, Bld: 106 mg/dL — ABNORMAL HIGH (ref 70–99)
Potassium: 3.8 mEq/L (ref 3.5–5.1)
Sodium: 138 mEq/L (ref 135–145)
Total Bilirubin: 0.6 mg/dL (ref 0.2–1.2)
Total Protein: 7.2 g/dL (ref 6.0–8.3)

## 2022-10-27 LAB — CBC WITH DIFFERENTIAL/PLATELET
Basophils Absolute: 0.1 10*3/uL (ref 0.0–0.1)
Basophils Relative: 1.2 % (ref 0.0–3.0)
Eosinophils Absolute: 0.3 10*3/uL (ref 0.0–0.7)
Eosinophils Relative: 3.3 % (ref 0.0–5.0)
HCT: 40.4 % (ref 36.0–46.0)
Hemoglobin: 13.5 g/dL (ref 12.0–15.0)
Lymphocytes Relative: 35.2 % (ref 12.0–46.0)
Lymphs Abs: 3.5 10*3/uL (ref 0.7–4.0)
MCHC: 33.4 g/dL (ref 30.0–36.0)
MCV: 88.1 fl (ref 78.0–100.0)
Monocytes Absolute: 0.8 10*3/uL (ref 0.1–1.0)
Monocytes Relative: 7.5 % (ref 3.0–12.0)
Neutro Abs: 5.3 10*3/uL (ref 1.4–7.7)
Neutrophils Relative %: 52.8 % (ref 43.0–77.0)
Platelets: 172 10*3/uL (ref 150.0–400.0)
RBC: 4.58 Mil/uL (ref 3.87–5.11)
RDW: 14.5 % (ref 11.5–15.5)
WBC: 10 10*3/uL (ref 4.0–10.5)

## 2022-10-27 LAB — LIPASE: Lipase: 20 U/L (ref 11.0–59.0)

## 2022-10-27 LAB — VITAMIN D 25 HYDROXY (VIT D DEFICIENCY, FRACTURES): VITD: 40.2 ng/mL (ref 30.00–100.00)

## 2022-11-04 ENCOUNTER — Ambulatory Visit: Payer: Medicare Other

## 2022-11-10 ENCOUNTER — Ambulatory Visit
Admission: RE | Admit: 2022-11-10 | Discharge: 2022-11-10 | Disposition: A | Discharge status: Home / Self Care | Payer: Medicare Other | Source: Ambulatory Visit | Attending: Family Medicine | Admitting: Family Medicine

## 2022-11-10 DIAGNOSIS — R1011 Right upper quadrant pain: Secondary | ICD-10-CM | POA: Diagnosis present

## 2023-01-19 ENCOUNTER — Other Ambulatory Visit: Payer: Self-pay

## 2023-01-19 DIAGNOSIS — I10 Essential (primary) hypertension: Secondary | ICD-10-CM

## 2023-01-19 MED ORDER — LOSARTAN POTASSIUM 50 MG PO TABS
50.0000 mg | ORAL_TABLET | Freq: Every day | ORAL | 3 refills | Status: DC
Start: 2023-01-19 — End: 2023-05-04

## 2023-01-19 NOTE — Telephone Encounter (Signed)
E-scribed refill 

## 2023-04-15 ENCOUNTER — Other Ambulatory Visit: Payer: Self-pay | Admitting: Family Medicine

## 2023-04-15 DIAGNOSIS — E559 Vitamin D deficiency, unspecified: Secondary | ICD-10-CM

## 2023-04-15 DIAGNOSIS — M85851 Other specified disorders of bone density and structure, right thigh: Secondary | ICD-10-CM

## 2023-04-15 DIAGNOSIS — E785 Hyperlipidemia, unspecified: Secondary | ICD-10-CM

## 2023-04-15 DIAGNOSIS — R7303 Prediabetes: Secondary | ICD-10-CM

## 2023-04-20 ENCOUNTER — Other Ambulatory Visit (INDEPENDENT_AMBULATORY_CARE_PROVIDER_SITE_OTHER): Payer: Medicare Other

## 2023-04-20 DIAGNOSIS — E559 Vitamin D deficiency, unspecified: Secondary | ICD-10-CM

## 2023-04-20 DIAGNOSIS — R7303 Prediabetes: Secondary | ICD-10-CM | POA: Diagnosis not present

## 2023-04-20 DIAGNOSIS — M85851 Other specified disorders of bone density and structure, right thigh: Secondary | ICD-10-CM | POA: Diagnosis not present

## 2023-04-20 DIAGNOSIS — E785 Hyperlipidemia, unspecified: Secondary | ICD-10-CM

## 2023-04-20 LAB — LIPID PANEL
Cholesterol: 171 mg/dL (ref 0–200)
HDL: 55.8 mg/dL (ref >39.00)
LDL Cholesterol: 94 mg/dL (ref 0–99)
NonHDL: 114.87
Total CHOL/HDL Ratio: 3
Triglycerides: 104 mg/dL (ref 0.0–149.0)
VLDL: 20.8 mg/dL (ref 0.0–40.0)

## 2023-04-20 LAB — COMPREHENSIVE METABOLIC PANEL
ALT: 16 U/L (ref 0–35)
AST: 18 U/L (ref 0–37)
Albumin: 3.8 g/dL (ref 3.5–5.2)
Alkaline Phosphatase: 73 U/L (ref 39–117)
BUN: 18 mg/dL (ref 6–23)
CO2: 30 meq/L (ref 19–32)
Calcium: 9 mg/dL (ref 8.4–10.5)
Chloride: 105 meq/L (ref 96–112)
Creatinine, Ser: 0.79 mg/dL (ref 0.40–1.20)
GFR: 76.6 mL/min (ref >60.00)
Glucose, Bld: 99 mg/dL (ref 70–99)
Potassium: 4.4 meq/L (ref 3.5–5.1)
Sodium: 141 meq/L (ref 135–145)
Total Bilirubin: 0.9 mg/dL (ref 0.2–1.2)
Total Protein: 6.8 g/dL (ref 6.0–8.3)

## 2023-04-20 LAB — TSH: TSH: 1.47 u[IU]/mL (ref 0.35–5.50)

## 2023-04-20 LAB — HEMOGLOBIN A1C: Hgb A1c MFr Bld: 5.9 % (ref 4.6–6.5)

## 2023-04-20 LAB — VITAMIN D 25 HYDROXY (VIT D DEFICIENCY, FRACTURES): VITD: 31.24 ng/mL (ref 30.00–100.00)

## 2023-04-21 ENCOUNTER — Ambulatory Visit: Payer: Medicare Other | Attending: Student | Admitting: Student

## 2023-04-21 ENCOUNTER — Encounter: Payer: Self-pay | Admitting: Student

## 2023-04-21 VITALS — BP 132/72 | HR 68 | Ht 61.0 in | Wt 223.4 lb

## 2023-04-21 DIAGNOSIS — I2510 Atherosclerotic heart disease of native coronary artery without angina pectoris: Secondary | ICD-10-CM | POA: Diagnosis present

## 2023-04-21 DIAGNOSIS — E785 Hyperlipidemia, unspecified: Secondary | ICD-10-CM | POA: Diagnosis present

## 2023-04-21 DIAGNOSIS — I5032 Chronic diastolic (congestive) heart failure: Secondary | ICD-10-CM | POA: Insufficient documentation

## 2023-04-21 DIAGNOSIS — I10 Essential (primary) hypertension: Secondary | ICD-10-CM | POA: Diagnosis present

## 2023-04-21 NOTE — Progress Notes (Signed)
Cardiology Clinic Note   Date: 04/21/2023 ID: Alveta, Swader 02-05-1956, MRN [id number]  Primary Cardiologist:  Yvonne Kendall, MD  Patient Profile    Stephanie Mullen is a 67 y.o. female who presents to the clinic today for routine follow up.     Past medical history significant for: Nonobstructive CAD. LHC 06/19/2022 (accelerating angina): D1 40%. RPDA 30%.  Mild nonobstructive CAD.  Patent collaterals from the right PDA to unspecified small branch and LCx. Chronic HFpEF.  Echo 09/09/2018: EF 60 to 65%.  Impaired relaxation.  Normal RV function.  Mild MR.  Hypertension.  Hyperlipidemia.  Lipid panel 04/20/2023: LDL 94, HDL 56, TG 104, total 171. GERD.  Asthma.      History of Present Illness    Stephanie Mullen is followed by Dr. Okey Dupre for the above outlined history.  In summary, patient was initially seen by Dr. Okey Dupre on 08/08/2018 shortness of breath and chest pressure at the request of Georgina Snell, NP.  She reported a history of similar symptoms 5 years prior for which she underwent cardiac catheterization at [facility] hospital which showed mild nonobstructive CAD.  Her symptoms had already began improving with Nexium and Symbicort.  Echo showed normal LV/RV function with impaired relaxation.  Patient was seen in follow-up December 2023 and reported progressive chest pressure with minimal exertion and associated jaw pain.  She underwent LHC which showed mild nonobstructive CAD.  At follow-up visit January 2024 she continued to experience atypical chest pain and metoprolol was increased to 25 mg twice daily.  She reported some lower extremity edema since December and Lasix was initiated.  Patient was last seen in the office by Dr. Okey Dupre on 08/07/2022 for routine follow-up.  She reported improvement of chest pain with the addition of famotidine at esomeprazole per PCP.  Lower extremity edema was mostly resolved with the addition of Lasix.  Discussed the use of AI  scribe software for clinical note transcription with the patient, who gave verbal consent to proceed.  The patient, with a history of nonobstructive coronary artery disease and hyperlipidemia, presents for a follow-up visit. She denies shortness of breath, dyspnea on exertion, lower extremity edema, orthopnea or PND. No chest pain, pressure, or tightness. No palpitations.  She reports stopping atorvastatin due to abdominal pain, which has since resolved. She has not tried any other cholesterol-lowering medications. LDL increased from 55 to 94 since discontinuing the atorvastatin. She has an upcoming visit with PCP and will discuss next steps with him.   The patient also reports recent COVID-19, confirmed by a home test. She had cold-like symptoms and a lingering cough, but no chest pain or shortness of breath. She also mentions a recent episode of back pain, which she attributed to a muscle spasm.   The patient has been sedentary due to recent flare in knee pain. She was walking for 15-20 minutes 1-2 days a week. She expressed a desire to return to regular exercise and mention an interest in water aerobics.        ROS: All other systems reviewed and are otherwise negative except as noted in History of Present Illness.  Studies Reviewed    EKG Interpretation Date/Time:  Wednesday April 21 2023 14:41:37 EDT Ventricular Rate:  68 PR Interval:  208 QRS Duration:  90 QT Interval:  416 QTC Calculation: 442 R Axis:   12  Text Interpretation: Normal sinus rhythm Normal ECG When compared with ECG of 07/08/2022 (not in [place]) no  significant changes Confirmed by Carlos Levering [phone]) on 04/21/2023 2:47:04 PM       Physical Exam    VS:  BP 132/72 (BP Location: Left Arm, Patient Position: Sitting)   Pulse 68   Ht 5\' 1"  (1.549 m)   Wt 223 lb 6 oz (101.3 kg)   SpO2 97%   BMI 42.21 kg/m  , BMI Body mass index is 42.21 kg/m.  GEN: Well nourished, well developed, in no acute distress. Neck:  No JVD or carotid bruits. Cardiac:  RRR. No murmurs. No rubs or gallops.   Respiratory:  Respirations regular and unlabored. Clear to auscultation without rales, wheezing or rhonchi. GI: Soft, nontender, nondistended. Extremities: Radials/DP/PT 2+ and equal bilaterally. No clubbing or cyanosis. No edema.  Skin: Warm and dry, no rash. Neuro: Strength intact.  Assessment & Plan      Nonobstructive CAD Per angiography December 2023.  Patient had improvement of atypical chest pain with the addition of famotidine and esomeprazole.  She denies chest pain, pressure or tightness. She has not been exercising recently secondary to a flare in knee pain. She was walking 15-20 minutes 1-2 times a week.  -Continue aspirin, metoprolol, as needed SL NTG. -Encouraged to return to walking and consider water aerobics.   Chronic HFpEF Echo March 2020 normal LV/RV function with impaired relaxation and mild MR. Patient denies lower extremity edema or shortness of breath. She has not needed Lasix in quite some time. Euvolemic and well compensated on exam.  -Continue losartan and prn Lasix.   Hypertension BP today 140/70 on intake and 132/72 on my recheck.  -Continue losartan.  Hyperlipidemia LDL increased to 94 from 55 while off Atorvastatin due to abdominal pain. Discussed the need for LDL to be less than 70 due to nonobstructive coronary artery disease. Patient to discuss with primary care provider about possible switch to Rosuvastatin. -Follow up with primary care provider.       Disposition: Return in 6 months or sooner as needed.          Signed, Etta Grandchild. Deondrick Searls, DNP, NP-C

## 2023-04-21 NOTE — Patient Instructions (Signed)
Medication Instructions:  No changes *If you need a refill on your cardiac medications before your next appointment, please call your pharmacy*   Lab Work: None ordered If you have labs (blood work) drawn today and your tests are completely normal, you will receive your results only by: [place] (if you have MyChart) OR A paper copy in the mail If you have any lab test that is abnormal or we need to change your treatment, we will call you to review the results.   Testing/Procedures: None ordered   Follow-Up: At [facility], you and your health needs are our priority.  As part of our continuing mission to provide you with exceptional heart care, we have created designated Provider Care Teams.  These Care Teams include your primary Cardiologist (physician) and Advanced Practice Providers (APPs -  Physician Assistants and Nurse Practitioners) who all work together to provide you with the care you need, when you need it.  We recommend signing up for the patient portal called "MyChart".  Sign up information is provided on this After Visit Summary.  MyChart is used to connect with patients for Virtual Visits (Telemedicine).  Patients are able to view lab/test results, encounter notes, upcoming appointments, etc.  Non-urgent messages can be sent to your provider as well.   To learn more about what you can do with MyChart, go to [email or web address].    Your next appointment:   6 month(s)  Provider:   You may see Nelva Bush, MD or one of the following Advanced Practice Providers on your designated Care Team:   Murray Hodgkins, NP Christell Faith, PA-C Cadence Kathlen Mody, PA-C Gerrie Nordmann, NP

## 2023-04-27 ENCOUNTER — Ambulatory Visit (INDEPENDENT_AMBULATORY_CARE_PROVIDER_SITE_OTHER): Payer: Medicare Other

## 2023-04-27 ENCOUNTER — Encounter: Payer: Medicare Other | Admitting: Family Medicine

## 2023-04-27 VITALS — Ht 61.0 in | Wt 223.0 lb

## 2023-04-27 DIAGNOSIS — Z0000 Encounter for general adult medical examination without abnormal findings: Secondary | ICD-10-CM

## 2023-04-27 NOTE — Patient Instructions (Signed)
Ms. Sardo , Thank you for taking time to come for your Medicare Wellness Visit. I appreciate your ongoing commitment to your health goals. Please review the following plan we discussed and let me know if I can assist you in the future.   Referrals/Orders/Follow-Ups/Clinician Recommendations: none  This is a list of the screening recommended for you and due dates:  Health Maintenance  Topic Date Due   COVID-19 Vaccine (4 - 2023-24 season) 05/13/2023*   Pneumonia Vaccine (2 of 2 - PPSV23 or PCV20) 04/26/2024*   Stool Blood Test  05/02/2023   Mammogram  02/21/2024   Medicare Annual Wellness Visit  04/26/2024   DTaP/Tdap/Td vaccine (2 - Td or Tdap) 02/24/2027   Flu Shot  Completed   DEXA scan (bone density measurement)  Completed   Hepatitis C Screening  Completed   Zoster (Shingles) Vaccine  Completed   HPV Vaccine  Aged Out   Colon Cancer Screening  Discontinued  *Topic was postponed. The date shown is not the original due date.    Advanced directives: (Copy Requested) Please bring a copy of your health care power of attorney and living will to the office to be added to your chart at your convenience.  Next Medicare Annual Wellness Visit scheduled for next year: Yes 04/28/24 @ 10:10am telephone

## 2023-04-27 NOTE — Progress Notes (Signed)
Subjective:   Stephanie Mullen is a 67 y.o. female who presents for Medicare Annual (Subsequent) preventive examination.  Visit Complete: Virtual I connected with  Stephanie Mullen on 04/27/23 by a audio enabled telemedicine application and verified that I am speaking with the correct person using two identifiers.  Patient Location: Home  Provider Location: Office/Clinic  I discussed the limitations of evaluation and management by telemedicine. The patient expressed understanding and agreed to proceed.  Vital Signs: Because this visit was a virtual/telehealth visit, some criteria may be missing or patient reported. Any vitals not documented were not able to be obtained and vitals that have been documented are patient reported.  Patient Medicare AWV questionnaire was completed by the patient on 04/25/2023; I have confirmed that all information answered by patient is correct and no changes since this date. Cardiac Risk Factors include: advanced age (>65men, >53 women);dyslipidemia;hypertension;obesity (BMI >30kg/m2);sedentary lifestyle    Objective:    Today's Vitals   04/27/23 0918  Weight: 223 lb (101.2 kg)  Height: 5\' 1"  (1.549 m)   Body mass index is 42.14 kg/m.     04/27/2023    9:27 AM 06/19/2022    9:14 AM 04/23/2021    8:56 AM 09/07/2019    9:33 AM  Advanced Directives  Does Patient Have a Medical Advance Directive? Yes No No No  Type of Estate agent of [place];Living will     Copy of Healthcare Power of Attorney in Chart? No - copy requested     Would patient like information on creating a medical advance directive?  No - Patient declined Yes (MAU/Ambulatory/Procedural Areas - Information given) Yes (MAU/Ambulatory/Procedural Areas - Information given)    Current Medications (verified) Outpatient Encounter Medications as of 04/27/2023  Medication Sig   acetaminophen (TYLENOL) 500 MG tablet Take 1 tablet (500 mg total) by mouth 2 (two) times  daily as needed for moderate pain.   aspirin EC 81 MG tablet Take 1 tablet (81 mg total) by mouth daily. Swallow whole.   Cholecalciferol (VITAMIN D3) 25 MCG (1000 UT) CAPS Take 1 capsule (1,000 Units total) by mouth daily.   Cyanocobalamin (VITAMIN B12 PO) Take by mouth daily.   losartan (COZAAR) 50 MG tablet Take 1 tablet (50 mg total) by mouth daily.   MAGNESIUM PO Take by mouth daily.   metoprolol tartrate (LOPRESSOR) 25 MG tablet Take 1 tablet (25 mg total) by mouth 2 (two) times daily.   Pyridoxine HCl (VITAMIN B6 PO) Take by mouth daily.   triamcinolone (NASACORT) 55 MCG/ACT AERO nasal inhaler Place 2 sprays into the nose daily as needed.   furosemide (LASIX) 20 MG tablet Take 1 tablet (20 mg total) by mouth daily. (Patient not taking: Reported on 04/21/2023)   nitroGLYCERIN (NITROSTAT) 0.4 MG SL tablet Place 1 tablet (0.4 mg total) under the tongue every 5 (five) minutes as needed for chest pain. Up to 3 doses   No facility-administered encounter medications on file as of 04/27/2023.    Allergies (verified) Amoxicillin-pot clavulanate and Celebrex [celecoxib]   History: Past Medical History:  Diagnosis Date   Arthritis    Asthma    Coronary artery disease    GERD (gastroesophageal reflux disease)    Hypertension    Past Surgical History:  Procedure Laterality Date   CARDIAC CATHETERIZATION  2015   nonobstructive CAD, nromal LV function (End)   COLONOSCOPY  06/2008   WNL, rpt 10 yrs ([place])   LEFT HEART CATH AND CORONARY ANGIOGRAPHY  Left 06/19/2022   Procedure: LEFT HEART CATH AND CORONARY ANGIOGRAPHY;  Surgeon: Iran Ouch, MD;  Location: [facility] INVASIVE CV LAB;  Service: Cardiovascular;  Laterality: Left;   LUMBAR DISC SURGERY  2003   R L4/5 hemilaminectomy and discectomy 2003 for L4/5 HNP   TONSILLECTOMY     Family History  Problem Relation Age of Onset   Asthma Mother    Diabetes Mother    Allergies Father    Heart disease Father 32       has several  blockages   Cystic fibrosis Sister    Obesity Brother        morbid   Sleep apnea Brother    Rheum arthritis Paternal Grandfather    Social History   Socioeconomic History   Marital status: Married    Spouse name: [name]   Number of children: 2   Years of education: College   Highest education level: Bachelor's degree (e.g., BA, AB, BS)  Occupational History   Not on file  Tobacco Use   Smoking status: Never    Passive exposure: Past   Smokeless tobacco: Never  Vaping Use   Vaping status: Never Used  Substance and Sexual Activity   Alcohol use: Yes    Comment: 1-2 times a year, 1 drink   Drug use: Never   Sexual activity: Not Currently    Birth control/protection: Post-menopausal  Other Topics Concern   Not on file  Social History Narrative   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    Has 2 children - one son in [place] (does not see him often), daughter lives nearby (gets a long)   Grandchildren: 1 grandchild   Lives with husband, [name]   Enjoys: arts and crafts, hiking (though limited by health)   Exercise: walking to the stop sign now   Diet: thinks she eats too much bread, trying to reduce sugar   Social Determinants of Health   Financial Resource Strain: Low Risk  (04/25/2023)   Overall Financial Resource Strain (CARDIA)    Difficulty of Paying Living Expenses: Not hard at all  Food Insecurity: No Food Insecurity (04/25/2023)   Hunger Vital Sign    Worried About Running Out of Food in the Last Year: Never true    Ran Out of Food in the Last Year: Never true  Transportation Needs: No Transportation Needs (04/25/2023)   PRAPARE - Administrator, Civil Service (Medical): No    Lack of Transportation (Non-Medical): No  Physical Activity: Inactive (04/25/2023)   Exercise Vital Sign    Days of Exercise per Week: 0 days    Minutes of Exercise per Session: 0 min  Stress: No  Stress Concern Present (04/25/2023)   [employer] of Occupational Health - Occupational Stress Questionnaire    Feeling of Stress : Only a little  Social Connections: Moderately Integrated (04/25/2023)   Social Connection and Isolation Panel [NHANES]    Frequency of Communication with Friends and Family: Never    Frequency of Social Gatherings with Friends and Family: Once a week    Attends Religious Services: More than 4 times per year    Active Member of [employer] or Organizations: Yes    Attends Banker Meetings: Never    Marital Status: Married    Tobacco Counseling Counseling given: Not Answered   Clinical Intake:  Pre-visit preparation completed: No  Pain : No/denies  pain     BMI - recorded: 42.14 Nutritional Status: BMI > 30  Obese Nutritional Risks: None Diabetes: No  How often do you need to have someone help you when you read instructions, pamphlets, or other written materials from your doctor or pharmacy?: 1 - Never  Interpreter Needed?: No  Comments: lives with husband Information entered by :: B.Wynn Kernes,LPN   Activities of Daily Living    04/25/2023    7:33 AM 06/19/2022    9:09 AM  In your present state of health, do you have any difficulty performing the following activities:  Hearing? 0 0  Vision? 0 0  Difficulty concentrating or making decisions? 0 0  Walking or climbing stairs? 0 0  Dressing or bathing? 0 0  Doing errands, shopping? 0   Preparing Food and eating ? N   Using the Toilet? N   In the past six months, have you accidently leaked urine? Y   Do you have problems with loss of bowel control? N   Managing your Medications? N   Managing your Finances? N   Housekeeping or managing your Housekeeping? N     Patient Care Team: Eustaquio Boyden, MD as PCP - General (Family Medicine) End, Cristal Deer, MD as PCP - Cardiology (Cardiology) Pa, [facility])  Indicate any recent Medical Services you may have  received from other than [facility] providers in the past year (date may be approximate).     Assessment:   This is a routine wellness examination for [place].  Hearing/Vision screen Hearing Screening - Comments:: Pt says her hearing is good Vision Screening - Comments:: Pt says her hearing is good with glasses  Eye   Goals Addressed               This Visit's Progress     COMPLETED: Exercise 3x per week (30 min per time) (pt-stated)   Not on track     Get outside and walk the subdivision circle. And hike around [place] hike      COMPLETED: Exercise 3x per week (30 min per time)   Not on track     Continue to get out and be more active - bike ride on electric bike       Depression Screen    04/27/2023    9:24 AM 10/26/2022    3:52 PM 07/27/2022    8:02 AM 04/24/2022   12:18 PM 07/24/2021    9:37 AM 04/23/2021    9:20 AM 04/23/2021    8:57 AM  PHQ 2/9 Scores  PHQ - 2 Score 0 0 0 0 0 0 0  PHQ- 9 Score   1 9 0 1     Fall Risk    04/25/2023    7:33 AM 07/27/2022    8:02 AM 04/24/2022   11:19 AM 04/23/2021    8:41 AM 09/07/2019    9:35 AM  Fall Risk   Falls in the past year? 1 1 1  0 0  Number falls in past yr: 0 1 1 0 0  Injury with Fall? 1 0 1  0  Comment   B lateral knee injuries, back pain, B hip pain    Risk for fall due to : No Fall Risks      Follow up Education provided;Falls prevention discussed        MEDICARE RISK AT HOME: Medicare Risk at Home Any stairs in or around the home?: Yes If so, are there any without handrails?: No Home  free of loose throw rugs in walkways, pet beds, electrical cords, etc?: Yes Adequate lighting in your home to reduce risk of falls?: Yes Life alert?: No Use of a cane, walker or w/c?: No Grab bars in the bathroom?: Yes Shower chair or bench in shower?: Yes Elevated toilet seat or a handicapped toilet?: Yes  TIMED UP AND GO:  Was the test performed?  No    Cognitive Function:        04/27/2023    9:29 AM  09/07/2019    9:36 AM  6CIT Screen  What Year? 0 points 0 points  What month? 0 points 0 points  What time? 0 points 0 points  Count back from 20 0 points 0 points  Months in reverse 0 points 0 points  Repeat phrase 0 points 0 points  Total Score 0 points 0 points    Immunizations Immunization History  Administered Date(s) Administered   Fluad Quad(high Dose 65+) 05/31/2020, 04/23/2021, 04/24/2022   Influenza, Seasonal, Injecte, Preservative Fre 04/03/2019   Influenza-Unspecified 04/13/2023   PFIZER(Purple Top)SARS-COV-2 Vaccination 08/11/2019, 09/05/2019, 03/30/2020   Pneumococcal Conjugate-13 12/11/2019   Tdap 02/23/2017   Zoster Recombinant(Shingrix) 12/26/2018, 03/06/2019    TDAP status: Up to date  Flu Vaccine status: Up to date  Pneumococcal vaccine status: Up to date  Covid-19 vaccine status: Completed vaccines  Qualifies for Shingles Vaccine? Yes   Zostavax completed Yes   Shingrix Completed?: Yes  Screening Tests Health Maintenance  Topic Date Due   COVID-19 Vaccine (4 - 2023-24 season) 05/13/2023 (Originally 02/28/2023)   Pneumonia Vaccine 52+ Years old (2 of 2 - PPSV23 or PCV20) 04/26/2024 (Originally 12/10/2020)   COLON CANCER SCREENING ANNUAL FOBT  05/02/2023   MAMMOGRAM  02/21/2024   Medicare Annual Wellness (AWV)  04/26/2024   DTaP/Tdap/Td (2 - Td or Tdap) 02/24/2027   INFLUENZA VACCINE  Completed   DEXA SCAN  Completed   Hepatitis C Screening  Completed   Zoster Vaccines- Shingrix  Completed   HPV VACCINES  Aged Out   Colonoscopy  Discontinued    Health Maintenance  There are no preventive care reminders to display for this patient.   Colorectal cancer screening: Type of screening: FOBT/FIT. Completed 05/02/23. Repeat every 3 years  Mammogram status: Completed 02/19/23. Repeat every year Pt wants to wait until next year  Bone Density status: Completed 10/04/19. Results reflect: Bone density results: OSTEOPENIA. Repeat every 3-5 years.  Lung  Cancer Screening: (Low Dose CT Chest recommended if Age 27-80 years, 20 pack-year currently smoking OR have quit w/in 15years.) does not qualify.   Lung Cancer Screening Referral: no  Additional Screening:  Hepatitis C Screening: does not qualify; Completed 09/07/2019  Vision Screening: Recommended annual ophthalmology exams for early detection of glaucoma and other disorders of the eye. Is the patient up to date with their annual eye exam?  Yes  Who is the provider or what is the name of the office in which the patient attends annual eye exams? [facility] If pt is not established with a provider, would they like to be referred to a provider to establish care? No .   Dental Screening: Recommended annual dental exams for proper oral hygiene  Diabetic Foot Exam: n/a  Community Resource Referral / Chronic Care Management: CRR required this visit?  No   CCM required this visit?  No     Plan:     I have personally reviewed and noted the following in the patient's chart:   Medical and  social history Use of alcohol, tobacco or illicit drugs  Current medications and supplements including opioid prescriptions. Patient is not currently taking opioid prescriptions. Functional ability and status Nutritional status Physical activity Advanced directives List of other physicians Hospitalizations, surgeries, and ER visits in previous 12 months Vitals Screenings to include cognitive, depression, and falls Referrals and appointments  In addition, I have reviewed and discussed with patient certain preventive protocols, quality metrics, and best practice recommendations. A written personalized care plan for preventive services as well as general preventive health recommendations were provided to patient.     Sue Lush, LPN   [id number]   After Visit Summary: (MyChart) Due to this being a telephonic visit, the after visit summary with patients personalized plan was offered to  patient via MyChart   Nurse Notes: The patient states she is doing well but expresses some problems with her left knee. She states she has not been walking due to knee pain and buckling. She also relays she has some moles about her hand and face she wants evaluated for removal. Pt intends to talk with PCP at next visit 05/04/23. No further questions or concerns voiced.

## 2023-05-04 ENCOUNTER — Ambulatory Visit
Admission: RE | Admit: 2023-05-04 | Discharge: 2023-05-04 | Disposition: A | Discharge status: Home / Self Care | Payer: Medicare Other | Source: Ambulatory Visit | Attending: Family Medicine | Admitting: Family Medicine

## 2023-05-04 ENCOUNTER — Ambulatory Visit (INDEPENDENT_AMBULATORY_CARE_PROVIDER_SITE_OTHER): Payer: Medicare Other | Admitting: Family Medicine

## 2023-05-04 ENCOUNTER — Encounter: Payer: Self-pay | Admitting: Family Medicine

## 2023-05-04 VITALS — BP 132/78 | HR 55 | Temp 98.0°F | Ht 60.5 in | Wt 221.1 lb

## 2023-05-04 DIAGNOSIS — I10 Essential (primary) hypertension: Secondary | ICD-10-CM

## 2023-05-04 DIAGNOSIS — D492 Neoplasm of unspecified behavior of bone, soft tissue, and skin: Secondary | ICD-10-CM | POA: Diagnosis not present

## 2023-05-04 DIAGNOSIS — M25562 Pain in left knee: Secondary | ICD-10-CM | POA: Diagnosis not present

## 2023-05-04 DIAGNOSIS — E785 Hyperlipidemia, unspecified: Secondary | ICD-10-CM

## 2023-05-04 DIAGNOSIS — Z7189 Other specified counseling: Secondary | ICD-10-CM | POA: Diagnosis not present

## 2023-05-04 DIAGNOSIS — Z1211 Encounter for screening for malignant neoplasm of colon: Secondary | ICD-10-CM

## 2023-05-04 DIAGNOSIS — I5032 Chronic diastolic (congestive) heart failure: Secondary | ICD-10-CM

## 2023-05-04 DIAGNOSIS — E559 Vitamin D deficiency, unspecified: Secondary | ICD-10-CM

## 2023-05-04 DIAGNOSIS — R7303 Prediabetes: Secondary | ICD-10-CM

## 2023-05-04 DIAGNOSIS — I2510 Atherosclerotic heart disease of native coronary artery without angina pectoris: Secondary | ICD-10-CM

## 2023-05-04 DIAGNOSIS — M85851 Other specified disorders of bone density and structure, right thigh: Secondary | ICD-10-CM

## 2023-05-04 MED ORDER — ROSUVASTATIN CALCIUM 5 MG PO TABS
5.0000 mg | ORAL_TABLET | Freq: Every day | ORAL | 3 refills | Status: DC
Start: 2023-05-04 — End: 2023-10-26

## 2023-05-04 MED ORDER — LOSARTAN POTASSIUM 50 MG PO TABS: 50.0000 mg | ORAL_TABLET | Freq: Every day | ORAL | 4 refills | Status: DC

## 2023-05-04 MED ORDER — FISH OIL 1000 MG PO CAPS: 1.0000 | ORAL_CAPSULE | Freq: Every day | ORAL | Status: AC

## 2023-05-04 NOTE — Assessment & Plan Note (Signed)
Saw cardiology, nonobstructive CAD, rec LDL goal <70. See below.

## 2023-05-04 NOTE — Patient Instructions (Addendum)
Call to schedule mammogram at your convenience: [facility] [phone].  Pass by lab to pick up stool kit.  Bring [place] copy of your living will.  Try rosuvastatin 5mg  nightly for cholesterol. Schedule lab visit in 3 months to recheck liver. Try exercises provided today for left knee - possible bursitis. May continue lidocaine roll. If no better, let us know for knee xrays.  Good to see you today  Return as needed or in 6 months for follow up visit   We will refer you to [facility] dermatology.

## 2023-05-04 NOTE — Assessment & Plan Note (Signed)
Discussed dietary calcium intake as well as daily vit D supplement.  Weight bearing activity currently limited by knee pain

## 2023-05-04 NOTE — Assessment & Plan Note (Addendum)
Overall reassuring exam.  Possible pes anserine bursitis, r/o osteoarthritis - will send home with knee bursitis exercises from [facility] pt advisor.  She will return for xrays if not improving with this.

## 2023-05-04 NOTE — Assessment & Plan Note (Addendum)
Stable period, euvolemic with PRN lasix.

## 2023-05-04 NOTE — Assessment & Plan Note (Signed)
A1c stable on current regimen - continue.

## 2023-05-04 NOTE — Progress Notes (Signed)
Ph: [phone] Fax: [phone]   Patient ID: Stephanie Mullen, female    DOB: [birth year], 67 y.o.   MRN: [id number]  This visit was conducted in person.  BP 132/78   Pulse (!) 55   Temp 98 F (36.7 C) (Oral)   Ht 5' 0.5" (1.537 m)   Wt 221 lb 2 oz (100.3 kg)   SpO2 98%   BMI 42.47 kg/m    CC: AMW f/u visit  Subjective:   HPI: Stephanie Mullen is a 67 y.o. female presenting on 05/04/2023 for Annual Exam (MCR prt 2 [AWV- 04/27/23].)   Saw health advisor last month for medicare wellness visit. Note reviewed.  Worsening left knee pain - see below. Would like moles on face and hand evaluated.   No results found.  Flowsheet Row Clinical Support from 04/27/2023 in [facility] [place]  PHQ-2 Total Score 0          04/25/2023    7:33 AM 07/27/2022    8:02 AM 04/24/2022   11:19 AM 04/23/2021    8:41 AM 09/07/2019    9:35 AM  Fall Risk   Falls in the past year? 1 1 1  0 0  Number falls in past yr: 0 1 1 0 0  Injury with Fall? 1 0 1  0  Comment   B lateral knee injuries, back pain, B hip pain    Risk for fall due to : No Fall Risks      Follow up Education provided;Falls prevention discussed      10/2022 - walking on a hill, missed step over ledge, fell, busted lip and hit teeth.   Notes worsening L knee worse when walking on it, can give out. Was walking 2-3 d/wk about 20 min at a time. Using roll on lidocaine with benefit. Knee pain ongoing for 6+ months. No locking of knee.   Saw cardiology for chest pain s/p catheterization showing mild-mod nonobstructive CAD, started on aspirin, furosemide, atorvastatin, metoprolol increased. Subsequently stopped atorvastatin and felt better. Saw cardiology last month, to consider rosuvastatin.   H/o thrombocytopenia to 50s, needed treatment in hospital for this. Hesitant to be on aspirin for this reason.   Preventative: Colon cancer screening - discussed, prefers yearly iFOB, normal 04/2022.  Remote  colonoscopy 2010 at [facility] clinic  Mammogram 01/2022 - Birads1 @ [facility] exam - last pap 2019, WNL, always normal.  LMP 2000s DEXA scan 09/2019 [place] - T -1.3 at R femur neck, not at increased risk of fracture.  Lung cancer screening - not eligible  Flu shot -today  COVID shot - Pfizer 2/201, 08/2019, booster 03/2020 Tdap 2018 Prevnar-13 2021 Shingrix - 11/2018, 02/2019  Advanced directive - completed through lawyer. Husband would be HCPOA. Asked to bring [place] a copy.  Seat belt use discussed Sunscreen use discussed. No changing moles on skin. Left hand growth that is irritated after trauma.  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 Has 2 children - one son in [place], daughter lives nearby  Lives with husband, [name] Enjoys: arts and crafts, hiking (though limited by health) Exercise: limited due to knee pain Diet: good water, fruits/vegetables daily  Relevant past medical, surgical, family and social history reviewed and updated as indicated. Interim medical history since our last visit reviewed. Allergies and medications reviewed and updated. Outpatient Medications Prior to Visit  Medication Sig Dispense Refill   acetaminophen (TYLENOL) 500 MG tablet Take 1 tablet (500 mg total) by mouth 2 (two) times daily as needed for moderate pain.     aspirin EC 81 MG tablet Take 1 tablet (81 mg total) by mouth daily. Swallow whole. 90 tablet 3   Cholecalciferol (VITAMIN D3) 25 MCG (1000 UT) CAPS Take 1 capsule (1,000 Units total) by mouth daily. 30 capsule    Cyanocobalamin (VITAMIN B12 PO) Take by mouth daily.     furosemide (LASIX) 20 MG tablet Take 1 tablet (20 mg total) by mouth daily. 90 tablet 3    MAGNESIUM PO Take by mouth daily.     metoprolol tartrate (LOPRESSOR) 25 MG tablet Take 1 tablet (25 mg total) by mouth 2 (two) times daily. 180 tablet 3   Pyridoxine HCl (VITAMIN B6 PO) Take by mouth daily.     triamcinolone (NASACORT) 55 MCG/ACT AERO nasal inhaler Place 2 sprays into the nose daily as needed.     losartan (COZAAR) 50 MG tablet Take 1 tablet (50 mg total) by mouth daily. 30 tablet 3   nitroGLYCERIN (NITROSTAT) 0.4 MG SL tablet Place 1 tablet (0.4 mg total) under the tongue every 5 (five) minutes as needed for chest pain. Up to 3 doses 25 tablet 3   No facility-administered medications prior to visit.     Per HPI unless specifically indicated in ROS section below Review of Systems  Objective:  BP 132/78   Pulse (!) 55   Temp 98 F (36.7 C) (Oral)   Ht 5' 0.5" (1.537 m)   Wt 221 lb 2 oz (100.3 kg)   SpO2 98%   BMI 42.47 kg/m   Wt Readings from Last 3 Encounters:  05/04/23 221 lb 2 oz (100.3 kg)  04/27/23 223 lb (101.2 kg)  04/21/23 223 lb 6 oz (101.3 kg)      Physical Exam Vitals and nursing note reviewed.  Constitutional:      Appearance: Normal appearance. She is not ill-appearing.  HENT:     Head: Normocephalic and atraumatic.     Right Ear: Tympanic membrane, ear canal and external ear normal. There is no impacted cerumen.     Left Ear: Tympanic membrane, ear canal and external ear normal. There is no impacted cerumen.     Mouth/Throat:     Mouth: Mucous membranes are moist.     Pharynx: Oropharynx is clear. No oropharyngeal exudate or posterior oropharyngeal erythema.  Eyes:     General:        Right eye: No discharge.        Left eye: No discharge.     Extraocular Movements: Extraocular movements intact.     Conjunctiva/sclera: Conjunctivae normal.     Pupils: Pupils are equal, round, and reactive to light.  Neck:     Thyroid: No thyroid mass or thyromegaly.     Vascular: No carotid bruit.  Cardiovascular:     Rate and Rhythm: Normal rate  and regular rhythm.     Pulses: Normal pulses.     Heart sounds: Normal heart sounds. No murmur heard. Pulmonary:     Effort: Pulmonary effort is normal. No respiratory distress.     Breath sounds: Normal breath sounds. No wheezing, rhonchi or rales.  Abdominal:  General: Bowel sounds are normal. There is no distension.     Palpations: Abdomen is soft. There is no mass.     Tenderness: There is no abdominal tenderness. There is no guarding or rebound.     Hernia: No hernia is present.  Musculoskeletal:     Cervical back: Normal range of motion and neck supple. No rigidity.     Right lower leg: No edema.     Left lower leg: No edema.     Comments:  R knee WNL L knee exam: No deformity on inspection. Discomfort with palpation of L>R pes anserine bursa. No significant effusion/swelling noted. FROM in flex/extension without crepitus. No popliteal fullness. Neg drawer test. Neg mcmurray test. No pain with valgus/varus stress. No PFgrind. No abnormal patellar mobility.   Lymphadenopathy:     Cervical: No cervical adenopathy.  Skin:    General: Skin is warm and dry.     Findings: Lesion present. No rash.     Comments: Subcm verrucous growth to left dorsal hand   Neurological:     General: No focal deficit present.     Mental Status: She is alert. Mental status is at baseline.  Psychiatric:        Mood and Affect: Mood normal.        Behavior: Behavior normal.       Results for orders placed or performed in visit on 04/20/23  TSH  Result Value Ref Range   TSH 1.47 0.35 - 5.50 uIU/mL  VITAMIN D 25 Hydroxy (Vit-D Deficiency, Fractures)  Result Value Ref Range   VITD 31.24 30.00 - 100.00 ng/mL  Hemoglobin A1c  Result Value Ref Range   Hgb A1c MFr Bld 5.9 4.6 - 6.5 %  Comprehensive metabolic panel  Result Value Ref Range   Sodium 141 135 - 145 mEq/L   Potassium 4.4 3.5 - 5.1 mEq/L   Chloride 105 96 - 112 mEq/L   CO2 30 19 - 32 mEq/L   Glucose, Bld 99 70 - 99 mg/dL    BUN 18 6 - 23 mg/dL   Creatinine, Ser 2.13 0.40 - 1.20 mg/dL   Total Bilirubin 0.9 0.2 - 1.2 mg/dL   Alkaline Phosphatase 73 39 - 117 U/L   AST 18 0 - 37 U/L   ALT 16 0 - 35 U/L   Total Protein 6.8 6.0 - 8.3 g/dL   Albumin 3.8 3.5 - 5.2 g/dL   GFR 08.65 >78.46 mL/min   Calcium 9.0 8.4 - 10.5 mg/dL  Lipid panel  Result Value Ref Range   Cholesterol 171 0 - 200 mg/dL   Triglycerides 962.9 0.0 - 149.0 mg/dL   HDL 52.84 >13.24 mg/dL   VLDL 40.1 0.0 - 02.7 mg/dL   LDL Cholesterol 94 0 - 99 mg/dL   Total CHOL/HDL Ratio 3    NonHDL 114.87     Assessment & Plan:   Problem List Items Addressed This Visit     Advanced directives, counseling/discussion - Primary (Chronic)    Advanced directive - completed through lawyer. Husband would be HCPOA. Asked to bring [place] a copy.       CAD in native artery    Saw cardiology, nonobstructive CAD, rec LDL goal <70. See below.       Relevant Medications   losartan (COZAAR) 50 MG tablet   rosuvastatin (CRESTOR) 5 MG tablet   Obesity, morbid, BMI 40.0-49.9 (HCC)    Continue to encourage healthy diet and lifestyle choices to affect sustainable weight  loss.       Essential hypertension    Chronic, stable on current regimen - continue.       Relevant Medications   losartan (COZAAR) 50 MG tablet   rosuvastatin (CRESTOR) 5 MG tablet   Hyperlipidemia LDL goal <70    Off statin due to transaminitis and abd discomfort. Agrees to retrial - start rosuvastatin 5mg  nightly. Update with effect. She also plans to start fish oil.  The 10-year ASCVD risk score (Arnett DK, et al., 2019) is: 10.2%   Values used to calculate the score:     Age: 66 years     Sex: Female     Is Non-Hispanic African American: No     Diabetic: No     Tobacco smoker: No     Systolic Blood Pressure: 132 mmHg     Is BP treated: Yes     HDL Cholesterol: 55.8 mg/dL     Total Cholesterol: 171 mg/dL       Relevant Medications   losartan (COZAAR) 50 MG tablet   rosuvastatin  (CRESTOR) 5 MG tablet   Prediabetes    A1c stable on current regimen - continue.       Osteopenia    Discussed dietary calcium intake as well as daily vit D supplement.  Weight bearing activity currently limited by knee pain      Left knee pain    Overall reassuring exam.  Possible pes anserine bursitis, r/o osteoarthritis - will send home with knee bursitis exercises from [facility] pt advisor.  She will return for xrays if not improving with this.       Relevant Orders   DG Knee Complete 4 Views Left   Chronic heart failure with preserved ejection fraction (HFpEF) (HCC)    Stable period, euvolemic with PRN lasix.       Relevant Medications   losartan (COZAAR) 50 MG tablet   rosuvastatin (CRESTOR) 5 MG tablet   Vitamin D deficiency    Continue vit D 1000 international units  daily.       Abnormal skin growth    Suspect irritated wart vs SK, r/o squamous cell.  Offered return for removal vs derm referral - will refer to dermatology to establish care.       Relevant Orders   Ambulatory referral to Dermatology   Other Visit Diagnoses     Special screening for malignant neoplasms, colon       Relevant Orders   Fecal occult blood, imunochemical        Meds ordered this encounter  Medications   losartan (COZAAR) 50 MG tablet    Sig: Take 1 tablet (50 mg total) by mouth daily.    Dispense:  90 tablet    Refill:  4   Omega-3 Fatty Acids (FISH OIL) 1000 MG CAPS    Sig: Take 1 capsule (1,000 mg total) by mouth daily.   rosuvastatin (CRESTOR) 5 MG tablet    Sig: Take 1 tablet (5 mg total) by mouth daily.    Dispense:  90 tablet    Refill:  3    Orders Placed This Encounter  Procedures   Fecal occult blood, imunochemical    Standing Status:   Future    Standing Expiration Date:   05/03/2024   DG Knee Complete 4 Views Left    Standing Status:   Future    Standing Expiration Date:   05/03/2024    Order Specific Question:   Reason for Exam (SYMPTOM  OR DIAGNOSIS REQUIRED)     Answer:   left knee pain without injury    Order Specific Question:   Preferred imaging location?    Answer:   [facility]   Ambulatory referral to Dermatology    Referral Priority:   Routine    Referral Type:   Consultation    Referral Reason:   Specialty Services Required    Requested Specialty:   Dermatology    Number of Visits Requested:   1    Patient Instructions  Call to schedule mammogram at your convenience: [facility] at [facility] [phone].  Pass by lab to pick up stool kit.  Bring [place] copy of your living will.  Try rosuvastatin 5mg  nightly for cholesterol. Schedule lab visit in 3 months to recheck liver. Try exercises provided today for left knee - possible bursitis. May continue lidocaine roll. If no better, let us know for knee xrays.  Good to see you today  Return as needed or in 6 months for follow up visit   We will refer you to [facility] dermatology.   Follow up plan: Return in about 6 months (around 11/01/2023), or if symptoms worsen or fail to improve.  Eustaquio Boyden, MD

## 2023-05-04 NOTE — Assessment & Plan Note (Signed)
Continue to encourage healthy diet and lifestyle choices to affect sustainable weight loss.  °

## 2023-05-04 NOTE — Assessment & Plan Note (Signed)
Off statin due to transaminitis and abd discomfort. Agrees to retrial - start rosuvastatin 5mg  nightly. Update with effect. She also plans to start fish oil.  The 10-year ASCVD risk score (Arnett DK, et al., 2019) is: 10.2%   Values used to calculate the score:     Age: 67 years     Sex: Female     Is Non-Hispanic African American: No     Diabetic: No     Tobacco smoker: No     Systolic Blood Pressure: 132 mmHg     Is BP treated: Yes     HDL Cholesterol: 55.8 mg/dL     Total Cholesterol: 171 mg/dL

## 2023-05-04 NOTE — Assessment & Plan Note (Signed)
Chronic, stable on current regimen - continue. 

## 2023-05-04 NOTE — Assessment & Plan Note (Addendum)
Advanced directive - completed through lawyer. Husband would be HCPOA. Asked to bring [place] a copy.

## 2023-05-04 NOTE — Assessment & Plan Note (Signed)
Suspect irritated wart vs SK, r/o squamous cell.  Offered return for removal vs derm referral - will refer to dermatology to establish care.

## 2023-05-04 NOTE — Assessment & Plan Note (Signed)
Continue vit D 1000 international units  daily.  

## 2023-05-04 NOTE — Addendum Note (Signed)
Addended by: Alvina Chou on: 05/04/2023 04:33 PM   Modules accepted: Orders

## 2023-05-07 ENCOUNTER — Other Ambulatory Visit: Payer: Self-pay | Admitting: Radiology

## 2023-05-07 DIAGNOSIS — Z1211 Encounter for screening for malignant neoplasm of colon: Secondary | ICD-10-CM

## 2023-05-07 LAB — FECAL OCCULT BLOOD, IMMUNOCHEMICAL: Fecal Occult Bld: NEGATIVE

## 2023-07-23 ENCOUNTER — Other Ambulatory Visit: Payer: Self-pay | Admitting: Internal Medicine

## 2023-08-01 ENCOUNTER — Other Ambulatory Visit: Payer: Self-pay | Admitting: Family Medicine

## 2023-08-01 DIAGNOSIS — E785 Hyperlipidemia, unspecified: Secondary | ICD-10-CM

## 2023-08-04 ENCOUNTER — Other Ambulatory Visit: Payer: Medicare Other

## 2023-08-04 DIAGNOSIS — E785 Hyperlipidemia, unspecified: Secondary | ICD-10-CM

## 2023-08-04 LAB — LIPID PANEL
Cholesterol: 155 mg/dL (ref 0–200)
HDL: 54.6 mg/dL (ref >39.00)
LDL Cholesterol: 87 mg/dL (ref 0–99)
NonHDL: 100.5
Total CHOL/HDL Ratio: 3
Triglycerides: 66 mg/dL (ref 0.0–149.0)
VLDL: 13.2 mg/dL (ref 0.0–40.0)

## 2023-08-04 LAB — HEPATIC FUNCTION PANEL
ALT: 19 U/L (ref 0–35)
AST: 31 U/L (ref 0–37)
Albumin: 3.9 g/dL (ref 3.5–5.2)
Alkaline Phosphatase: 66 U/L (ref 39–117)
Bilirubin, Direct: 0.1 mg/dL (ref 0.0–0.3)
Total Bilirubin: 0.8 mg/dL (ref 0.2–1.2)
Total Protein: 7.2 g/dL (ref 6.0–8.3)

## 2023-08-05 ENCOUNTER — Encounter: Payer: Self-pay | Admitting: Family Medicine

## 2023-10-20 ENCOUNTER — Other Ambulatory Visit: Payer: Self-pay | Admitting: Internal Medicine

## 2023-10-20 NOTE — Telephone Encounter (Signed)
 Hi,  Could this patient be scheduled a 6 month follow up visit? The patient was last seen by D. Wittenborn on 04-21-2023. Thank you so much.

## 2023-10-24 NOTE — Progress Notes (Unsigned)
 Cardiology Clinic Note   Date: 10/26/2023 ID: Stephanie Mullen 1956/01/11, MRN [id number]  Primary Cardiologist:  Sammy Crisp, MD  Chief Complaint   Stephanie Mullen is a 68 y.o. female who presents to the clinic today for routine follow up.   Patient Profile   Stephanie Mullen is followed by Dr. Nolan Battle for the history outlined below.      Past medical history significant for: Nonobstructive CAD. LHC 06/19/2022 (accelerating angina): D1 40%. RPDA 30%.  Mild nonobstructive CAD.  Patent collaterals from the right PDA to unspecified small branch and LCx. Chronic HFpEF.  Echo 09/09/2018: EF 60 to 65%.  Impaired relaxation.  Normal RV function.  Mild MR.  Hypertension.  Hyperlipidemia.  Lipid panel 08/04/2023: LDL 87, HDL 55, TG 66, total 155. GERD.  Asthma.    In summary, patient was initially seen by Dr. Nolan Battle on 08/08/2018 shortness of breath and chest pressure at the request of Jonathon Neighbors, NP.  She reported a history of similar symptoms 5 years prior for which she underwent cardiac catheterization at [facility] hospital which showed mild nonobstructive CAD.  Her symptoms had already began improving with Nexium  and Symbicort .  Echo showed normal LV/RV function with impaired relaxation.  Patient was seen in follow-up December 2023 and reported progressive chest pressure with minimal exertion and associated jaw pain.  She underwent LHC which showed mild nonobstructive CAD.  At follow-up visit January 2024 she continued to experience atypical chest pain and metoprolol  was increased to 25 mg twice daily.  She reported some lower extremity edema since December and Lasix  was initiated.   Patient was seen in the office by Dr. Nolan Battle in February 2024 for routine follow-up.  She reported improvement of chest pain with the addition of famotidine  at esomeprazole  per PCP.  Lower extremity edema was mostly resolved with the addition of Lasix .  Patient was last seen in the office by me  on 04/21/2023 for routine follow-up.  She was recovering from recent COVID infection.  She had been sedentary secondary to knee pain.  She was encouraged to begin walking program and consider water aerobics.  No other changes were made.  It was noted that her LDL had increased while off atorvastatin  due to abdominal pain.  LDL of less than 70 was discussed and she wished to defer possible trial of rosuvastatin  to PCP.     History of Present Illness    Today, patient is doing well. She is back to walking at least once a week and has been doing pool aerobics 1-2 times a week. She reports some mild dyspnea with exercise but anticipates this will improve the more she gets back into a routine. She also reports feeling "tired" in her chest that is different from previous anginal pain. Again, she feels this will improve as she sticks to her routine. She reports chronic lower extremity edema L>R that is chronic and essentially unchanged. She noticed it was a little worse last week when she returned from her walk and the top of her left foot was painful but that has resolved. She mentions that she was restarted on Crestor  by PCP but abdominal pain returned so she stopped it and pain resolved. She continues to work but has decreased to 1 day a week with hopes of fully retiring after November.      ROS: All other systems reviewed and are otherwise negative except as noted in History of Present Illness.  EKGs/Labs Reviewed  EKG Interpretation Date/Time:  Tuesday October 26 2023 13:29:08 EDT Ventricular Rate:  79 PR Interval:  208 QRS Duration:  86 QT Interval:  374 QTC Calculation: 428 R Axis:   8  Text Interpretation: Normal sinus rhythm Minimal voltage criteria for LVH, may be normal variant ( R in aVL ) Cannot rule out Anterior infarct , age undetermined When compared with ECG of 21-Apr-2023 14:41, No significant change was found Confirmed by Morey Ar [phone]) on 10/26/2023 1:37:37 PM    04/20/2023: BUN 18; Creatinine, Ser 0.79; Potassium 4.4; Sodium 141 08/04/2023: ALT 19; AST 31   10/26/2022: Hemoglobin 13.5; WBC 10.0   04/20/2023: TSH 1.47   Physical Exam    VS:  BP 132/84   Pulse 77   Ht 5\' 1"  (1.549 m)   Wt 232 lb 3.2 oz (105.3 kg)   SpO2 95%   BMI 43.87 kg/m  , BMI Body mass index is 43.87 kg/m.  GEN: Well nourished, well developed, in no acute distress. Neck: No JVD or carotid bruits. Cardiac:  RRR. No murmurs. No rubs or gallops.   Respiratory:  Respirations regular and unlabored. Clear to auscultation without rales, wheezing or rhonchi. GI: Soft, nontender, nondistended. Extremities: Radials/DP/PT 2+ and equal bilaterally. No clubbing or cyanosis. No edema.  Skin: Warm and dry, no rash. Neuro: Strength intact.  Assessment & Plan   Nonobstructive CAD Per angiography December 2023.  Patient denies anginal pain. She states feeling a little "tired" in her chest after exercising but feels this will improve as she gets back into a routine. She is active walking at least once a week and doing water aerobics 1-2 times a week.  -Advance physical activity as tolerated.  -Continue aspirin , metoprolol , as needed SL NTG.   Chronic HFpEF Echo March 2020 normal LV/RV function with impaired relaxation and mild MR. Patient reports chronic intermittent lower extremity edema that is stable. She had an increase in edema in her left foot after walking last week and the top of her foot was painful but it has since resolved and not returned.  Euvolemic and well compensated on exam.  -Continue losartan  and prn Lasix .    Hypertension BP today 132/84. No report of headaches or dizziness.  -Continue losartan .   Hyperlipidemia LDL 87 in February 2025, not at goal.  Patient reports she stopped rosuvastatin , as she developed abdominal pain that resolved once she stopped it. She is willing to try Zetia.  -Start Zetia 10 mg daily.  -Repeat lipid panel and LFTs in 3 months.    Disposition: Zetia 10 mg daily. Lipid panel and LFTs in 3 months. Return in 6 months or sooner as needed.          Signed, Lonell Rives. Enez Monahan, DNP, NP-C

## 2023-10-26 ENCOUNTER — Ambulatory Visit: Attending: Student | Admitting: Student

## 2023-10-26 ENCOUNTER — Encounter: Payer: Self-pay | Admitting: Student

## 2023-10-26 VITALS — BP 132/84 | HR 77 | Ht 61.0 in | Wt 232.2 lb

## 2023-10-26 DIAGNOSIS — I2510 Atherosclerotic heart disease of native coronary artery without angina pectoris: Secondary | ICD-10-CM | POA: Diagnosis present

## 2023-10-26 DIAGNOSIS — I10 Essential (primary) hypertension: Secondary | ICD-10-CM | POA: Insufficient documentation

## 2023-10-26 DIAGNOSIS — E785 Hyperlipidemia, unspecified: Secondary | ICD-10-CM | POA: Insufficient documentation

## 2023-10-26 DIAGNOSIS — I5032 Chronic diastolic (congestive) heart failure: Secondary | ICD-10-CM | POA: Diagnosis present

## 2023-10-26 MED ORDER — EZETIMIBE 10 MG PO TABS: 10.0000 mg | ORAL_TABLET | Freq: Every day | ORAL | 3 refills | Status: AC

## 2023-10-26 NOTE — Patient Instructions (Signed)
 Medication Instructions:  Your physician has recommended you make the following change in your medication:   STOP Rosuvastatin  START Zetia (ezetimibe) 10 mg once daily  *If you need a refill on your cardiac medications before your next appointment, please call your pharmacy*  Lab Work: Lipid & LFT in 3 months. These are fasting labs so nothing to eat or drink after midnight before except sip of water with your medications. Come to our office for labs to be done. NO appointment is needed. Hours are 8-4 and they are closed 1-2 for lunch.    If you have labs (blood work) drawn today and your tests are completely normal, you will receive your results only by: MyChart Message (if you have MyChart) OR A paper copy in the mail If you have any lab test that is abnormal or we need to change your treatment, we will call you to review the results.  Testing/Procedures: None  Follow-Up: At [facility], you and your health needs are our priority.  As part of our continuing mission to provide you with exceptional heart care, our providers are all part of one team.  This team includes your primary Cardiologist (physician) and Advanced Practice Providers or APPs (Physician Assistants and Nurse Practitioners) who all work together to provide you with the care you need, when you need it.  Your next appointment:   6 month(s)  Provider:   Sammy Crisp, MD or Morey Ar, NP

## 2023-11-01 ENCOUNTER — Ambulatory Visit (INDEPENDENT_AMBULATORY_CARE_PROVIDER_SITE_OTHER): Payer: Medicare Other | Admitting: Family Medicine

## 2023-11-01 ENCOUNTER — Encounter: Payer: Self-pay | Admitting: Family Medicine

## 2023-11-01 VITALS — BP 134/86 | HR 89 | Temp 98.1°F | Ht 61.0 in | Wt 230.5 lb

## 2023-11-01 DIAGNOSIS — R300 Dysuria: Secondary | ICD-10-CM | POA: Insufficient documentation

## 2023-11-01 DIAGNOSIS — E785 Hyperlipidemia, unspecified: Secondary | ICD-10-CM | POA: Diagnosis not present

## 2023-11-01 DIAGNOSIS — I10 Essential (primary) hypertension: Secondary | ICD-10-CM | POA: Diagnosis not present

## 2023-11-01 LAB — POC URINALSYSI DIPSTICK (AUTOMATED)
Bilirubin, UA: NEGATIVE
Blood, UA: NEGATIVE
Glucose, UA: NEGATIVE
Ketones, UA: NEGATIVE
Leukocytes, UA: NEGATIVE
Nitrite, UA: NEGATIVE
Protein, UA: NEGATIVE
Spec Grav, UA: >=1.03 — AB (ref 1.010–1.025)
Urobilinogen, UA: 0.2 U/dL
pH, UA: 6 (ref 5.0–8.0)

## 2023-11-01 NOTE — Addendum Note (Signed)
 Addended by: Nikeia Henkes on: 11/01/2023 08:49 AM   Modules accepted: Orders

## 2023-11-01 NOTE — Assessment & Plan Note (Signed)
 Chronic, stable on current regimen - continue.

## 2023-11-01 NOTE — Assessment & Plan Note (Signed)
 Statin trial x2 caused abd pain, transaminitis. Desires to stay off statin.  Cardiology recent started her on ezetimibe  and she seems to be tolerating well - will continue. The 10-year ASCVD risk score (Arnett DK, et al., 2019) is: 11.4%   Values used to calculate the score:     Age: 68 years     Sex: Female     Is Non-Hispanic African American: No     Diabetic: No     Tobacco smoker: No     Systolic Blood Pressure: 134 mmHg     Is BP treated: Yes     HDL Cholesterol: 54.6 mg/dL     Total Cholesterol: 155 mg/dL

## 2023-11-01 NOTE — Assessment & Plan Note (Addendum)
 Symptoms ongoing x1+ month. UA today concentrated but otherwise normal. UCx sent given typical UTI symptoms.

## 2023-11-01 NOTE — Progress Notes (Signed)
 Ph: [phone] Fax: [phone]   Patient ID: Stephanie Mullen, female    DOB: 1956/05/23, 68 y.o.   MRN: [id number]  This visit was conducted in person.  BP 134/86   Pulse 89   Temp 98.1 F (36.7 C) (Oral)   Ht 5\' 1"  (1.549 m)   Wt 230 lb 8 oz (104.6 kg)   SpO2 94%   BMI 43.55 kg/m    CC: 6 mo f/u visit  Subjective:   HPI: FREDIE Mullen is a 68 y.o. female presenting on 11/01/2023 for Medical Management of Chronic Issues (Here for 6 mo f/u.)   HLD - only on zetia  10mg  daily - started last week. She did not tolerate rosuvastatin  trial due to stomach pains. She wants to avoid statins.   HTN - Compliant with current antihypertensive regimen of metoprolol  25mg  bid, losartan  50mg  daily, lasix  20mg  daily prn - no recent use. Does check blood pressures at home: 130s/80s. No low blood pressure readings or symptoms of dizziness/syncope. Denies HA, vision changes, CP/tightness, SOB, leg swelling.   1 month h/o dysuria, urgency, frequency, bilateral flank ache, waking up overnight.  No fevers/chills, nausea/vomiting, flank pain or hematuria.  Self treated with [place] urinary tract cleanse and protect with cranberry and hibiscus extract with benefit.  No recent UTI.  No recent abx use.      Relevant past medical, surgical, family and social history reviewed and updated as indicated. Interim medical history since our last visit reviewed. Allergies and medications reviewed and updated. Outpatient Medications Prior to Visit  Medication Sig Dispense Refill   acetaminophen (TYLENOL) 500 MG tablet Take 1 tablet (500 mg total) by mouth 2 (two) times daily as needed for moderate pain.     aspirin  EC 81 MG tablet Take 1 tablet (81 mg total) by mouth daily. Swallow whole. 90 tablet 3   Cholecalciferol (VITAMIN D3) 25 MCG (1000 UT) CAPS Take 1 capsule (1,000 Units total) by mouth daily. 30 capsule    Cyanocobalamin (VITAMIN B12 PO) Take by mouth daily.     ezetimibe  (ZETIA ) 10 MG  tablet Take 1 tablet (10 mg total) by mouth daily. 90 tablet 3   furosemide  (LASIX ) 20 MG tablet Take 1 tablet (20 mg total) by mouth daily. 90 tablet 3   losartan  (COZAAR ) 50 MG tablet Take 1 tablet (50 mg total) by mouth daily. 90 tablet 4   MAGNESIUM  PO Take by mouth daily.     metoprolol  tartrate (LOPRESSOR ) 25 MG tablet Take 1 tablet by mouth twice daily 180 tablet 0   nitroGLYCERIN  (NITROSTAT ) 0.4 MG SL tablet Place 1 tablet (0.4 mg total) under the tongue every 5 (five) minutes as needed for chest pain. Up to 3 doses 25 tablet 3   Omega-3 Fatty Acids (FISH OIL ) 1000 MG CAPS Take 1 capsule (1,000 mg total) by mouth daily.     Pyridoxine HCl (VITAMIN B6 PO) Take by mouth daily.     triamcinolone  (NASACORT ) 55 MCG/ACT AERO nasal inhaler Place 2 sprays into the nose daily as needed.     No facility-administered medications prior to visit.     Per HPI unless specifically indicated in ROS section below Review of Systems  Objective:  BP 134/86   Pulse 89   Temp 98.1 F (36.7 C) (Oral)   Ht 5\' 1"  (1.549 m)   Wt 230 lb 8 oz (104.6 kg)   SpO2 94%   BMI 43.55 kg/m   Wt Readings from Last 3  Encounters:  11/01/23 230 lb 8 oz (104.6 kg)  10/26/23 232 lb 3.2 oz (105.3 kg)  05/04/23 221 lb 2 oz (100.3 kg)      Physical Exam Vitals and nursing note reviewed.  Constitutional:      Appearance: Normal appearance. She is not ill-appearing.  HENT:     Mouth/Throat:     Mouth: Mucous membranes are moist.     Pharynx: Oropharynx is clear. No oropharyngeal exudate or posterior oropharyngeal erythema.  Eyes:     Extraocular Movements: Extraocular movements intact.     Pupils: Pupils are equal, round, and reactive to light.  Cardiovascular:     Rate and Rhythm: Normal rate and regular rhythm.     Pulses: Normal pulses.     Heart sounds: Normal heart sounds. No murmur heard. Pulmonary:     Effort: Pulmonary effort is normal. No respiratory distress.     Breath sounds: Normal breath  sounds. No wheezing, rhonchi or rales.  Abdominal:     General: Bowel sounds are normal. There is no distension.     Palpations: Abdomen is soft. There is no mass.     Tenderness: There is no abdominal tenderness. There is no right CVA tenderness, left CVA tenderness, guarding or rebound.     Hernia: No hernia is present.  Musculoskeletal:     Right lower leg: No edema.     Left lower leg: No edema.  Skin:    General: Skin is warm and dry.     Findings: No rash.  Neurological:     Mental Status: She is alert.  Psychiatric:        Mood and Affect: Mood normal.        Behavior: Behavior normal.       Results for orders placed or performed in visit on 11/01/23  POCT Urinalysis Dipstick (Automated)   Collection Time: 11/01/23  8:31 AM  Result Value Ref Range   Color, UA yellow    Clarity, UA clear    Glucose, UA Negative Negative   Bilirubin, UA negative    Ketones, UA negative    Spec Grav, UA >=1.030 (A) 1.010 - 1.025   Blood, UA negative    pH, UA 6.0 5.0 - 8.0   Protein, UA Negative Negative   Urobilinogen, UA 0.2 0.2 or 1.0 E.U./dL   Nitrite, UA negative    Leukocytes, UA Negative Negative    Assessment & Plan:   Problem List Items Addressed This Visit     Essential hypertension   Chronic, stable on current regimen - continue       Hyperlipidemia LDL goal <70   Statin trial x2 caused abd pain, transaminitis. Desires to stay off statin.  Cardiology recent started her on ezetimibe  and she seems to be tolerating well - will continue. The 10-year ASCVD risk score (Arnett DK, et al., 2019) is: 11.4%   Values used to calculate the score:     Age: 16 years     Sex: Female     Is Non-Hispanic African American: No     Diabetic: No     Tobacco smoker: No     Systolic Blood Pressure: 134 mmHg     Is BP treated: Yes     HDL Cholesterol: 54.6 mg/dL     Total Cholesterol: 155 mg/dL       Dysuria - Primary   Symptoms ongoing x1+ month. UA today concentrated but  otherwise normal. UCx sent given typical UTI symptoms.  Relevant Orders   POCT Urinalysis Dipstick (Automated) (Completed)     No orders of the defined types were placed in this encounter.   Orders Placed This Encounter  Procedures   POCT Urinalysis Dipstick (Automated)    Patient Instructions  Blood pressures are doing well Urine looking ok - we will send culture to ensure no infection.  Hopefully you're able to tolerate zetia  well.  Return as needed or in 6 months for next wellness visit.   Follow up plan: Return in about 6 months (around 05/03/2024) for annual exam, prior fasting for blood work.  Claire Crick, MD

## 2023-11-01 NOTE — Patient Instructions (Addendum)
 Blood pressures are doing well Urine looking ok - we will send culture to ensure no infection.  Hopefully you're able to tolerate zetia  well.  Return as needed or in 6 months for next wellness visit.

## 2023-11-02 LAB — URINE CULTURE
MICRO NUMBER:: 16412983
Result:: NO GROWTH
SPECIMEN QUALITY:: ADEQUATE

## 2023-11-03 ENCOUNTER — Encounter: Payer: Self-pay | Admitting: Family Medicine

## 2023-12-30 ENCOUNTER — Ambulatory Visit (INDEPENDENT_AMBULATORY_CARE_PROVIDER_SITE_OTHER): Admitting: Family Medicine

## 2023-12-30 ENCOUNTER — Ambulatory Visit: Payer: Self-pay

## 2023-12-30 ENCOUNTER — Encounter: Payer: Self-pay | Admitting: Family Medicine

## 2023-12-30 VITALS — BP 138/84 | HR 76 | Temp 98.0°F | Ht 61.0 in | Wt 230.0 lb

## 2023-12-30 DIAGNOSIS — H6590 Unspecified nonsuppurative otitis media, unspecified ear: Secondary | ICD-10-CM | POA: Diagnosis not present

## 2023-12-30 MED ORDER — FUROSEMIDE 20 MG PO TABS: 20.0000 mg | ORAL_TABLET | Freq: Every day | ORAL | Status: DC | PRN

## 2023-12-30 NOTE — Telephone Encounter (Signed)
 FYI Only or Action Required?: FYI only for provider.  Patient was last seen in primary care on 11/01/2023 by [name], MD. Called Nurse Triage reporting Otalgia. Symptoms began x 2 weeks. Interventions attempted: Nothing. Symptoms are: unchanged.  Triage Disposition: See PCP When Office is Open (Within 3 Days)  Patient/caregiver understands and will follow disposition?: Yes  ** Appt scheduled for 7/3**                           Copied from CRM [id number]. Topic: Clinical - Red Word Triage >> Dec 30, 2023 11:02 AM [place] A wrote: [facility] that prompted transfer to Nurse Triage: Patient said that she has had a cold and took 10 days antibiotics, but can not hear. Patient said there was pain but it has settle down some. Reason for Disposition  [1] Decreased hearing in 1 ear AND [2] gradual onset    BIL ears  Answer Assessment - Initial Assessment Questions 1. DESCRIPTION: What type of hearing problem are you having? Describe it for me. (e.g., complete hearing loss, partial loss)     She feels as if water is in her ear, the sound is muffled.   2. LOCATION: One or both ears? If one, ask: Which ear?     BIL ears  3. SEVERITY: Can you hear anything? If Yes, ask: What can you hear? (e.g., ticking watch, whisper, talking)   - MILD:  Difficulty hearing soft speech, quiet library sounds, or speech from a distance or over background noise.   - MODERATE: Difficulty hearing normal speech even at closed distances.   - SEVERE: Unable to hear most normal conversation and talking; only able to hear loud sounds such as an alarm clock.      Moderate   4. ONSET: When did this begin? Did it start suddenly or come on gradually?     X 2 weeks, suddenly  5. PATTERN: Does this come and go, or has it been constant since it started?     Constant   6. PAIN: Is there any pain in your ear(s)?  (Scale 1-10; or mild, moderate, severe)   - NONE (0): no pain   -  MILD (1-3): doesn't interfere with normal activities    - MODERATE (4-7): interferes with normal activities or awakens from sleep    - SEVERE (8-10): excruciating pain, unable to do any normal activities       No pain   7. CAUSE: What do you think is causing this hearing problem?     Recent illness  8. OTHER SYMPTOMS: Do you have any other symptoms? (e.g., dizziness, ringing in ears) No   X3 weeks ago patient was ill. After the illness she has ear pain, now resolved, but hearing has decreased. She completed prednisone and antibiotics. No other symptoms noted.  Protocols used: Hearing Loss or Change-A-AH

## 2023-12-30 NOTE — Patient Instructions (Signed)
 Gently try to pop you ears. Use flonase daily.   The fluid behind the ear drum should go away and you hearing should normalize.  Update us  as needed.  Take care.  Glad to see you.

## 2023-12-30 NOTE — Progress Notes (Signed)
 Sx started 12/14/23.  She had a cold, started taking dayquil and nyquil.  Had elevated BP with that.    Tried changing to pseudophed.  Still had ear pain. Started taking aleve for pain, that helped R ear pain.    Hearing changes noted in the menatime.    Seen 12/20/23, [facility].  Took prednisone for 5 days, doxycyline for 10 days.  Eye drops helped with eye matting.   Pain is better but still some mild throbbing and popping in the R ear.  Hearing is still muffled on R side.  L ear with better hearing the R ear.    URI sx are better overall.    Meds, vitals, and allergies reviewed.   ROS: Per HPI unless specifically indicated in ROS section   Nad Ncat R SOM noted without erythema. L TM wnl MMM Neck supple. Rrr Ctab Air conduction intact bilaterally on tuning fork exam with Weber testing louder on the right as expected.

## 2023-12-30 NOTE — Telephone Encounter (Signed)
 FYI for appt today with Dr. Cleatus

## 2023-12-30 NOTE — Telephone Encounter (Signed)
 Appreciate Dr Cleatus seeing this pleasant pt.

## 2023-12-30 NOTE — Telephone Encounter (Signed)
Will see at OV.  Thanks.  

## 2024-01-02 NOTE — Assessment & Plan Note (Signed)
 With muffled hearing on the affected side with normal hearing testing but Weber louder on the affected side, as expected.  Anatomy discussed with patient. Can use Flonase. Gently perform Valsalva. Should improve/resolve.  Update us  as needed.  She agrees with plan.

## 2024-01-16 ENCOUNTER — Other Ambulatory Visit: Payer: Self-pay | Admitting: Internal Medicine

## 2024-04-21 NOTE — Progress Notes (Signed)
 Cardiology Clinic Note   Date: 04/24/2024 ID: Stephanie Mullen 10-Jan-1956, MRN [id number]  Primary Cardiologist:  Lonni Hanson, MD  Chief Complaint   Stephanie Mullen is a 68 y.o. female who presents to the clinic today for routine follow up.   Patient Profile   Stephanie Mullen is followed by Dr. Hanson for the history outlined below.      Past medical history significant for: Nonobstructive CAD. LHC 06/19/2022 (accelerating angina): D1 40%. RPDA 30%.  Mild nonobstructive CAD.  Patent collaterals from the right PDA to unspecified small branch and LCx. Chronic HFpEF.  Echo 09/09/2018: EF 60 to 65%.  Impaired relaxation.  Normal RV function.  Mild MR.  Hypertension.  Hyperlipidemia.  Lipid panel 08/04/2023: LDL 87, HDL 55, TG 66, total 155. GERD.  Asthma.    In summary, patient was initially seen by Dr. Hanson on 08/08/2018 shortness of breath and chest pressure at the request of Stephanie Molt, NP.  She reported a history of similar symptoms 5 years prior for which she underwent cardiac catheterization at [facility] hospital which showed mild nonobstructive CAD.  Her symptoms had already began improving with Nexium  and Symbicort .  Echo showed normal LV/RV function with impaired relaxation.  Patient was seen in follow-up December 2023 and reported progressive chest pressure with minimal exertion and associated jaw pain.  She underwent LHC which showed mild nonobstructive CAD.  At follow-up visit January 2024 she continued to experience atypical chest pain and metoprolol  was increased to 25 mg twice daily.  She reported some lower extremity edema since December and Lasix  was initiated.   Patient was seen in the office by Dr. Hanson in February 2024 for routine follow-up.  She reported improvement of chest pain with the addition of famotidine  at esomeprazole  per PCP.  Lower extremity edema was mostly resolved with the addition of Lasix .  Patient was seen in the office on  04/21/2023 for routine follow-up.  She was recovering from recent COVID infection.  She had been sedentary secondary to knee pain.  She was encouraged to begin walking program and consider water aerobics.  No other changes were made.  It was noted that her LDL had increased while off atorvastatin  due to abdominal pain.  LDL of less than 70 was discussed and she wished to defer possible trial of rosuvastatin  to PCP.   Patient was last seen in the office by me on 10/26/2023 for routine follow-up.  She had returned to walking at least once a week and doing pool aerobics 1-2 times a week and was feeling improved with mild DOE she felt was getting better.  PCP had started Crestor  but abdominal pain returned so it was again stopped with resolution of discomfort.  Zetia  was added with plan to repeat lipid panel and LFTs in 3 months.     History of Present Illness    Today, patient reports she is doing well from a heart standpoint. She has had a flare in joint pain in her hip and knees requiring occasional NSAIDs. She has noticed when she takes an NSAID she gets a burning discomfort in the center of her chest that improves with eating. She does make sure she has eaten before taking the NSAID. She is going to try taking a Tums prior to dose of NSAID to see if that helps. She has noticed mild edema in her feet and ankles that worsens when she she sits with legs in dependent position and improves with  elevation. She endorses recent dietary indiscretion with sodium while traveling to [facility].  She has not taken Lasix  in a number of years but wonders if she should have it as needed. She has chronic dyspnea with exertion that is unchanged from previous. She continues to participate in water aerobics for 1 hour twice a week. She is not walking as she was previously secondary to joint pain.     ROS: All other systems reviewed and are otherwise negative except as noted in History of Present Illness.  EKGs/Labs Reviewed     EKG Interpretation Date/Time:  Monday April 24 2024 08:08:08 EDT Ventricular Rate:  67 PR Interval:  206 QRS Duration:  84 QT Interval:  414 QTC Calculation: 437 R Axis:   10  Text Interpretation: Normal sinus rhythm Normal ECG When compared with ECG of 26-Oct-2023 13:29, No significant change was found Confirmed by Loistine Sober [phone]) on 04/24/2024 8:12:08 AM   08/04/2023: ALT 19; AST 31   Risk Assessment/Calculations      HYPERTENSION CONTROL Vitals:   04/24/24 0801 04/24/24 0809 04/24/24 0938  BP: (!) 142/96 (!) 140/98 (!) 140/86    The patient's blood pressure is elevated above target today.  In order to address the patient's elevated BP: Blood pressure will be monitored at home to determine if medication changes need to be made.           Physical Exam    VS:  BP (!) 140/86 (BP Location: Left Arm, Patient Position: Sitting, Cuff Size: Large)   Pulse 67   Ht 5' 1 (1.549 m)   Wt 233 lb (105.7 kg)   SpO2 97%   BMI 44.02 kg/m  , BMI Body mass index is 44.02 kg/m.  GEN: Well nourished, well developed, in no acute distress. Neck: No JVD or carotid bruits. Cardiac:  RRR.  No murmur. No rubs or gallops.   Respiratory:  Respirations regular and unlabored. Clear to auscultation without rales, wheezing or rhonchi. GI: Soft, nontender, nondistended. Extremities: Radials/DP/PT 2+ and equal bilaterally. No clubbing or cyanosis. No edema.  Skin: Warm and dry, no rash. Neuro: Strength intact.  Assessment & Plan   Nonobstructive CAD Per angiography December 2023.  Patient reports burning central chest pain after taking NSAIDs relived with eating. She is going to try taking Tums prior to NSAID to see if that helps. She was not having any chest pain prior to starting NSAIDs for flare of joint pain. She would like to defer ischemic evaluation for now. She remains active doing water aerobics for 1 hour twice a week. She has not been walking like she was previously  secondary to joint pain.  - Instructed to contact the office if chest pain becomes more frequent or persistent.  - Advance physical activity as tolerated.  - Continue aspirin , metoprolol , as needed SL NTG. - CBC today.    Chronic HFpEF Echo March 2020 normal LV/RV function with impaired relaxation and mild MR. Patient reports dyspnea at baseline. She recently noticed more edema in feet and ankles. She has not taken Lasix  for several years but feels she may need to have it on hand to take as needed.   Euvolemic and well compensated on exam.  - Continue losartan .  - Restart prn Lasix  20 mg daily as needed. - Weigh daily.     Hypertension BP today 140/98 on intake and 140/86 on my recheck. She has not been checking BP at home. No report of headaches or dizziness.  - Continue  losartan  and metoprolol .  - Contact office if home BP consistently >130/80.    Hyperlipidemia LDL 87 in February 2025, not at goal.  Intolerant to statins secondary to abdominal pain. - Continue Zetia .  - Repeat lipid panel and CMP today.   Disposition: Lipid panel, CMP and CBC today. Renew Lasix  20 mg daily as needed. Return in 6 months or sooner as needed.          Signed, Barnie HERO. Jamyra Zweig, DNP, NP-C

## 2024-04-24 ENCOUNTER — Ambulatory Visit: Attending: Student | Admitting: Student

## 2024-04-24 ENCOUNTER — Encounter: Payer: Self-pay | Admitting: Student

## 2024-04-24 VITALS — BP 140/86 | HR 67 | Ht 61.0 in | Wt 233.0 lb

## 2024-04-24 DIAGNOSIS — Z79899 Other long term (current) drug therapy: Secondary | ICD-10-CM | POA: Insufficient documentation

## 2024-04-24 DIAGNOSIS — I2510 Atherosclerotic heart disease of native coronary artery without angina pectoris: Secondary | ICD-10-CM | POA: Insufficient documentation

## 2024-04-24 DIAGNOSIS — I10 Essential (primary) hypertension: Secondary | ICD-10-CM | POA: Diagnosis not present

## 2024-04-24 DIAGNOSIS — I5032 Chronic diastolic (congestive) heart failure: Secondary | ICD-10-CM | POA: Diagnosis not present

## 2024-04-24 DIAGNOSIS — E785 Hyperlipidemia, unspecified: Secondary | ICD-10-CM | POA: Insufficient documentation

## 2024-04-24 MED ORDER — FUROSEMIDE 20 MG PO TABS: 20.0000 mg | ORAL_TABLET | Freq: Every day | ORAL | 1 refills | Status: AC | PRN

## 2024-04-24 NOTE — Patient Instructions (Signed)
 Medication Instructions:   Renew Lasix  20 mg by mouth once a day as needed  Take all other medications as prescribed. *If you need a refill on your cardiac medications before your next appointment, please call your pharmacy*  Lab Work: Your provider would like for you to have following labs drawn today LIPID PANEL, CMET, CBC.   If you have labs (blood work) drawn today and your tests are completely normal, you will receive your results only by: MyChart Message (if you have MyChart) OR A paper copy in the mail If you have any lab test that is abnormal or we need to change your treatment, we will call you to review the results.  Testing/Procedures:  No test ordered today   Follow-Up: At [facility], you and your health needs are our priority.  As part of our continuing mission to provide you with exceptional heart care, our providers are all part of one team.  This team includes your primary Cardiologist (physician) and Advanced Practice Providers or APPs (Physician Assistants and Nurse Practitioners) who all work together to provide you with the care you need, when you need it.  Your next appointment:   6 month(s)  Provider:   You may see Lonni Hanson, MD or one of the following Advanced Practice Providers on your designated Care Team:    Barnie Hila, NP   We recommend signing up for the patient portal called MyChart.  Sign up information is provided on this After Visit Summary.  MyChart is used to connect with patients for Virtual Visits (Telemedicine).  Patients are able to view lab/test results, encounter notes, upcoming appointments, etc.  Non-urgent messages can be sent to your provider as well.   To learn more about what you can do with MyChart, go to [email or web address].

## 2024-04-25 ENCOUNTER — Ambulatory Visit: Payer: Self-pay | Admitting: Student

## 2024-04-25 LAB — CBC
Hematocrit: 42.3 % (ref 34.0–46.6)
Hemoglobin: 13.3 g/dL (ref 11.1–15.9)
MCH: 29.1 pg (ref 26.6–33.0)
MCHC: 31.4 g/dL — ABNORMAL LOW (ref 31.5–35.7)
MCV: 93 fL (ref 79–97)
Platelets: 214 x10E3/uL (ref 150–450)
RBC: 4.57 x10E6/uL (ref 3.77–5.28)
RDW: 13 % (ref 11.7–15.4)
WBC: 8.2 x10E3/uL (ref 3.4–10.8)

## 2024-04-25 LAB — COMPREHENSIVE METABOLIC PANEL WITH GFR
ALT: 18 IU/L (ref 0–32)
AST: 19 IU/L (ref 0–40)
Albumin: 4.1 g/dL (ref 3.9–4.9)
Alkaline Phosphatase: 81 IU/L (ref 49–135)
BUN/Creatinine Ratio: 19 (ref 12–28)
BUN: 16 mg/dL (ref 8–27)
Bilirubin Total: 0.8 mg/dL (ref 0.0–1.2)
CO2: 24 mmol/L (ref 20–29)
Calcium: 9.5 mg/dL (ref 8.7–10.3)
Chloride: 104 mmol/L (ref 96–106)
Creatinine, Ser: 0.83 mg/dL (ref 0.57–1.00)
Globulin, Total: 2.7 g/dL (ref 1.5–4.5)
Glucose: 97 mg/dL (ref 70–99)
Potassium: 4.8 mmol/L (ref 3.5–5.2)
Sodium: 140 mmol/L (ref 134–144)
Total Protein: 6.8 g/dL (ref 6.0–8.5)
eGFR: 76 mL/min/1.73 (ref >59)

## 2024-04-25 LAB — LIPID PANEL
Chol/HDL Ratio: 2.8 ratio (ref 0.0–4.4)
Cholesterol, Total: 143 mg/dL (ref 100–199)
HDL: 51 mg/dL (ref >39)
LDL Chol Calc (NIH): 72 mg/dL (ref 0–99)
Triglycerides: 109 mg/dL (ref 0–149)
VLDL Cholesterol Cal: 20 mg/dL (ref 5–40)

## 2024-04-26 NOTE — Progress Notes (Signed)
 Last read by Nena VEAR Medicine at 8:19AM on 04/26/2024.

## 2024-05-05 ENCOUNTER — Other Ambulatory Visit: Payer: Self-pay | Admitting: Family Medicine

## 2024-05-05 DIAGNOSIS — I10 Essential (primary) hypertension: Secondary | ICD-10-CM

## 2024-05-30 ENCOUNTER — Encounter

## 2024-07-14 ENCOUNTER — Ambulatory Visit

## 2024-07-14 VITALS — BP 132/80 | HR 60 | Ht 61.0 in | Wt 233.6 lb

## 2024-07-14 DIAGNOSIS — Z0000 Encounter for general adult medical examination without abnormal findings: Secondary | ICD-10-CM

## 2024-07-14 DIAGNOSIS — Z1231 Encounter for screening mammogram for malignant neoplasm of breast: Secondary | ICD-10-CM

## 2024-07-14 NOTE — Progress Notes (Signed)
 "  Chief Complaint  Patient presents with   Medicare Wellness     Subjective:   Stephanie Mullen is a 69 y.o. female who presents for a Medicare Annual Wellness Visit.  Visit info / Clinical Intake: Medicare Wellness Visit Type:: Subsequent Annual Wellness Visit Persons participating in visit and providing information:: patient Medicare Wellness Visit Mode:: In-person (required for WTM) Interpreter Needed?: No Pre-visit prep was completed: yes AWV questionnaire completed by patient prior to visit?: yes Date:: 07/09/24 Living arrangements:: lives with spouse/significant other Patient's Overall Health Status Rating: good Typical amount of pain: some Does pain affect daily life?: (!) yes Are you currently prescribed opioids?: no  Dietary Habits and Nutritional Risks How many meals a day?: 3 Eats fruit and vegetables daily?: (!) no Most meals are obtained by: preparing own meals In the last 2 weeks, have you had any of the following?: none Diabetic:: no  Functional Status Activities of Daily Living (to include ambulation/medication): Independent Ambulation: Independent with device- listed below Home Assistive Devices/Equipment: Eyeglasses; Cane Medication Administration: Independent Home Management (perform basic housework or laundry): Independent Manage your own finances?: yes Primary transportation is: driving Concerns about vision?: no *vision screening is required for WTM* Concerns about hearing?: no  Fall Screening Falls in the past year?: 0 Number of falls in past year: 0 Was there an injury with Fall?: 0 Fall Risk Category Calculator: 0 Patient Fall Risk Level: Low Fall Risk  Fall Risk Patient at Risk for Falls Due to: No Fall Risks Fall risk Follow up: Falls evaluation completed; Falls prevention discussed  Home and Transportation Safety: All rugs have non-skid backing?: yes All stairs or steps have railings?: yes Grab bars in the bathtub or shower?:  yes Have non-skid surface in bathtub or shower?: yes Good home lighting?: yes Regular seat belt use?: yes Hospital stays in the last year:: no  Cognitive Assessment Difficulty concentrating, remembering, or making decisions? : no Will 6CIT or Mini Cog be Completed: yes What year is it?: 0 points What month is it?: 0 points Give patient an address phrase to remember (5 components): [address] [place], [place] About what time is it?: 0 points Count backwards from 20 to 1: 0 points Say the months of the year in reverse: 0 points  Advance Directives (For Healthcare) Does Patient Have a Medical Advance Directive?: Yes Does patient want to make changes to medical advance directive?: Yes (Inpatient - patient requests chaplain consult to change a medical advance directive) Type of Advance Directive: Healthcare Power of [place]; Living will Copy of Healthcare Power of Attorney in Chart?: No - copy requested Copy of Living Will in Chart?: No - copy requested  Reviewed/Updated  Reviewed/Updated: Reviewed All (Medical, Surgical, Family, Medications, Allergies, Care Teams, Patient Goals)    Allergies (verified) Amoxicillin -pot clavulanate, Atorvastatin , and Celebrex [celecoxib]   Current Medications (verified) Outpatient Encounter Medications as of 07/14/2024  Medication Sig   acetaminophen (TYLENOL) 500 MG tablet Take 1 tablet (500 mg total) by mouth 2 (two) times daily as needed for moderate pain.   aspirin  EC 81 MG tablet Take 1 tablet (81 mg total) by mouth daily. Swallow whole.   Cholecalciferol (VITAMIN D3) 25 MCG (1000 UT) CAPS Take 1 capsule (1,000 Units total) by mouth daily.   Cyanocobalamin (VITAMIN B12 PO) Take by mouth daily.   ezetimibe  (ZETIA ) 10 MG tablet Take 1 tablet (10 mg total) by mouth daily.   furosemide  (LASIX ) 20 MG tablet Take 1 tablet (20 mg total) by  mouth daily as needed.   ibuprofen (ADVIL) 400 MG tablet Take 400 mg by mouth every 6 (six) hours as needed for  mild pain (pain score 1-3).   losartan  (COZAAR ) 50 MG tablet Take 1 tablet by mouth once daily   MAGNESIUM  PO Take by mouth daily.   metoprolol  tartrate (LOPRESSOR ) 25 MG tablet Take 1 tablet by mouth twice daily   nitroGLYCERIN  (NITROSTAT ) 0.4 MG SL tablet Place 1 tablet (0.4 mg total) under the tongue every 5 (five) minutes as needed for chest pain. Up to 3 doses   Omega-3 Fatty Acids (FISH OIL ) 1000 MG CAPS Take 1 capsule (1,000 mg total) by mouth daily.   Pyridoxine HCl (VITAMIN B6 PO) Take by mouth daily.   triamcinolone  (NASACORT ) 55 MCG/ACT AERO nasal inhaler Place 2 sprays into the nose daily as needed.   No facility-administered encounter medications on file as of 07/14/2024.    History: Past Medical History:  Diagnosis Date   Arthritis    Asthma    Coronary artery disease    GERD (gastroesophageal reflux disease)    Hypertension    Past Surgical History:  Procedure Laterality Date   CARDIAC CATHETERIZATION  2015   nonobstructive CAD, nromal LV function (End)   COLONOSCOPY  06/2008   WNL, rpt 10 yrs Jonne)   LEFT HEART CATH AND CORONARY ANGIOGRAPHY Left 06/19/2022   Procedure: LEFT HEART CATH AND CORONARY ANGIOGRAPHY;  Surgeon: Darron Deatrice LABOR, MD;  Location: [facility] INVASIVE CV LAB;  Service: Cardiovascular;  Laterality: Left;   LUMBAR DISC SURGERY  2003   R L4/5 hemilaminectomy and discectomy 2003 for L4/5 HNP   TONSILLECTOMY     Family History  Problem Relation Age of Onset   Asthma Mother    Diabetes Mother    Allergies Father    Heart disease Father 16       has several blockages   Cystic fibrosis Sister    Obesity Brother        morbid   Sleep apnea Brother    Rheum arthritis Paternal Grandfather    Social History   Occupational History   Not on file  Tobacco Use   Smoking status: Never    Passive exposure: Past   Smokeless tobacco: Never  Vaping Use   Vaping status: Never Used  Substance and Sexual Activity   Alcohol use: Not Currently    Drug use: Never   Sexual activity: Not Currently    Birth control/protection: Post-menopausal   Tobacco Counseling Counseling given: No  SDOH Screenings   Food Insecurity: No Food Insecurity (07/14/2024)  Housing: Unknown (07/14/2024)  Transportation Needs: No Transportation Needs (07/14/2024)  Utilities: Not At Risk (07/14/2024)  Depression (PHQ2-9): Low Risk (07/14/2024)  Financial Resource Strain: Low Risk (07/09/2024)  Physical Activity: Insufficiently Active (07/14/2024)  Social Connections: Moderately Integrated (07/14/2024)  Stress: Stress Concern Present (07/14/2024)  Tobacco Use: Low Risk (07/14/2024)  Health Literacy: Adequate Health Literacy (07/14/2024)   See flowsheets for full screening details  Depression Screen PHQ 2 & 9 Depression Scale- Over the past 2 weeks, how often have you been bothered by any of the following problems? Little interest or pleasure in doing things: 0 Feeling down, depressed, or hopeless (PHQ Adolescent also includes...irritable): 0 PHQ-2 Total Score: 0 Trouble falling or staying asleep, or sleeping too much: 1 (gets 7hrs of sleep; if having discomfort <5hrs (now on Advil for pain and help with sleeping)) Feeling tired or having little energy: 0 Poor appetite or overeating (PHQ  Adolescent also includes...weight loss): 0 Feeling bad about yourself - or that you are a failure or have let yourself or your family down: 0 Trouble concentrating on things, such as reading the newspaper or watching television (PHQ Adolescent also includes...like school work): 0 Moving or speaking so slowly that other people could have noticed. Or the opposite - being so fidgety or restless that you have been moving around a lot more than usual: 1 (uses a cane sometimes) Thoughts that you would be better off dead, or of hurting yourself in some way: 0 PHQ-9 Total Score: 2 If you checked off any problems, how difficult have these problems made it for you to do your work, take care  of things at home, or get along with other people?: Not difficult at all  Depression Treatment Depression Interventions/Treatment : EYV7-0 Score <4 Follow-up Not Indicated; Medication; Currently on Treatment     Goals Addressed               This Visit's Progress     Patient Stated (pt-stated)        Patient stated she plans to continue taking meds daily and monitors her BP readings             Objective:    Today's Vitals   07/14/24 0924  BP: 132/80  Pulse: 60  SpO2: 95%  Weight: 233 lb 9.6 oz (106 kg)  Height: 5' 1 (1.549 m)   Body mass index is 44.14 kg/m.  Hearing/Vision screen Hearing Screening - Comments:: Denies hearing difficulties   Vision Screening - Comments:: Wears rx glasses - up to date with routine eye exams with Dr Dingledein - [facility] Immunizations and Health Maintenance Health Maintenance  Topic Date Due   Pneumococcal Vaccine: 50+ Years (2 of 2 - PPSV23, PCV20, or PCV21) 02/05/2020   Mammogram  02/21/2024   COVID-19 Vaccine (4 - 2025-26 season) 02/28/2024   COLON CANCER SCREENING ANNUAL FOBT  05/06/2024   Medicare Annual Wellness (AWV)  07/14/2025   DTaP/Tdap/Td (2 - Td or Tdap) 02/24/2027   Influenza Vaccine  Completed   Bone Density Scan  Completed   Hepatitis C Screening  Completed   Zoster Vaccines- Shingrix  Completed   Meningococcal B Vaccine  Aged Out   Colonoscopy  Discontinued        Assessment/Plan:  This is a routine wellness examination for Delayni.  I have recommended that this patient have a immunization for Pneumonia and a Colonoscopy but she declines at this time. I have discussed the risks and benefits of this procedure with her. The patient verbalizes understanding.   Patient Care Team: [name], MD as PCP - General (Family Medicine) End, Lonni, MD as PCP - Cardiology (Cardiology) Dingeldein, Elspeth, MD (Ophthalmology)  I have personally reviewed and noted the following in the patients  chart:   Medical and social history Use of alcohol, tobacco or illicit drugs  Current medications and supplements including opioid prescriptions. Functional ability and status Nutritional status Physical activity Advanced directives List of other physicians Hospitalizations, surgeries, and ER visits in previous 12 months Vitals Screenings to include cognitive, depression, and falls Referrals and appointments  Orders Placed This Encounter  Procedures   MM 3D SCREENING MAMMOGRAM BILATERAL BREAST    Standing Status:   Future    Expiration Date:   07/14/2025    Reason for Exam (SYMPTOM  OR DIAGNOSIS REQUIRED):   screening for breast cancer    Preferred imaging location?:  [facility]   In addition, I have reviewed and discussed with patient certain preventive protocols, quality metrics, and best practice recommendations. A written personalized care plan for preventive services as well as general preventive health recommendations were provided to patient.   Verdie CHRISTELLA Saba, CMA   07/14/2024   Return in 1 year (on 07/14/2025).  After Visit Summary: (In Person-Declined) Patient declined AVS at this time.  Nurse Notes: scheduled a 9-mth f/u appt w/PCP; scheduled 2027 AWV appt "

## 2024-07-14 NOTE — Patient Instructions (Addendum)
 Stephanie Mullen,  Thank you for taking the time for your Medicare Wellness Visit. I appreciate your continued commitment to your health goals. Please review the care plan we discussed, and feel free to reach out if I can assist you further.  Please note that Annual Wellness Visits do not include a physical exam. Some assessments may be limited, especially if the visit was conducted virtually. If needed, we may recommend an in-person follow-up with your provider.  Ongoing Care Seeing your primary care provider every 3 to 6 months helps us  monitor your health and provide consistent, personalized care.   Referrals If a referral was made during today's visit and you haven't received any updates within two weeks, please contact the referred provider directly to check on the status.  Recommended Screenings:  Health Maintenance  Topic Date Due   Pneumococcal Vaccine for age over 67 (2 of 2 - PPSV23, PCV20, or PCV21) 02/05/2020   Flu Shot  01/28/2024   Breast Cancer Screening  02/21/2024   COVID-19 Vaccine (4 - 2025-26 season) 02/28/2024   Stool Blood Test  05/06/2024   Medicare Annual Wellness Visit  07/14/2025   DTaP/Tdap/Td vaccine (2 - Td or Tdap) 02/24/2027   Osteoporosis screening with Bone Density Scan  Completed   Hepatitis C Screening  Completed   Zoster (Shingles) Vaccine  Completed   Meningitis B Vaccine  Aged Out   Colon Cancer Screening  Discontinued       07/14/2024    9:26 AM  Advanced Directives  Does Patient Have a Medical Advance Directive? Yes  Type of Estate Agent of [place];Living will  Does patient want to make changes to medical advance directive? Yes (Inpatient - patient requests chaplain consult to change a medical advance directive)  Copy of Healthcare Power of Attorney in Chart? No - copy requested    Vision: Annual vision screenings are recommended for early detection of glaucoma, cataracts, and diabetic retinopathy. These exams can also  reveal signs of chronic conditions such as diabetes and high blood pressure.  Dental: Annual dental screenings help detect early signs of oral cancer, gum disease, and other conditions linked to overall health, including heart disease and diabetes.

## 2024-08-01 ENCOUNTER — Other Ambulatory Visit: Payer: Self-pay | Admitting: Family Medicine

## 2024-08-01 ENCOUNTER — Encounter: Payer: Self-pay | Admitting: Family Medicine

## 2024-08-01 ENCOUNTER — Ambulatory Visit: Admitting: Family Medicine

## 2024-08-01 VITALS — BP 150/98 | HR 57 | Temp 98.0°F | Ht 61.0 in | Wt 233.4 lb

## 2024-08-01 DIAGNOSIS — E785 Hyperlipidemia, unspecified: Secondary | ICD-10-CM

## 2024-08-01 DIAGNOSIS — I2510 Atherosclerotic heart disease of native coronary artery without angina pectoris: Secondary | ICD-10-CM

## 2024-08-01 DIAGNOSIS — I5032 Chronic diastolic (congestive) heart failure: Secondary | ICD-10-CM

## 2024-08-01 DIAGNOSIS — I10 Essential (primary) hypertension: Secondary | ICD-10-CM | POA: Diagnosis not present

## 2024-08-01 DIAGNOSIS — E559 Vitamin D deficiency, unspecified: Secondary | ICD-10-CM

## 2024-08-01 DIAGNOSIS — Z1211 Encounter for screening for malignant neoplasm of colon: Secondary | ICD-10-CM

## 2024-08-01 DIAGNOSIS — R051 Acute cough: Secondary | ICD-10-CM

## 2024-08-01 DIAGNOSIS — M7062 Trochanteric bursitis, left hip: Secondary | ICD-10-CM | POA: Insufficient documentation

## 2024-08-01 DIAGNOSIS — R059 Cough, unspecified: Secondary | ICD-10-CM | POA: Insufficient documentation

## 2024-08-01 DIAGNOSIS — M85851 Other specified disorders of bone density and structure, right thigh: Secondary | ICD-10-CM

## 2024-08-01 DIAGNOSIS — Z7189 Other specified counseling: Secondary | ICD-10-CM

## 2024-08-01 DIAGNOSIS — R7303 Prediabetes: Secondary | ICD-10-CM

## 2024-08-01 DIAGNOSIS — J4520 Mild intermittent asthma, uncomplicated: Secondary | ICD-10-CM

## 2024-08-01 MED ORDER — NITROGLYCERIN 0.4 MG SL SUBL: 0.4000 mg | SUBLINGUAL_TABLET | SUBLINGUAL | 1 refills | Status: AC | PRN

## 2024-08-01 MED ORDER — METHOCARBAMOL 500 MG PO TABS: 500.0000 mg | ORAL_TABLET | Freq: Two times a day (BID) | ORAL | 0 refills | Status: AC | PRN

## 2024-08-01 MED ORDER — BENZONATATE 100 MG PO CAPS: 100.0000 mg | ORAL_CAPSULE | Freq: Two times a day (BID) | ORAL | 0 refills | Status: AC | PRN

## 2024-08-01 MED ORDER — LOSARTAN POTASSIUM 100 MG PO TABS: 100.0000 mg | ORAL_TABLET | Freq: Every day | ORAL | 3 refills | Status: AC

## 2024-08-01 MED ORDER — ALBUTEROL SULFATE HFA 108 (90 BASE) MCG/ACT IN AERS: 2.0000 | INHALATION_SPRAY | Freq: Four times a day (QID) | RESPIRATORY_TRACT | 1 refills | Status: AC | PRN

## 2024-08-01 NOTE — Assessment & Plan Note (Signed)
 Advanced directive - scanned 09/2023. Husband [name] followed by [facility] are HCPOA. Wants HCPOA to decide regarding life prolonging measures.

## 2024-08-01 NOTE — Assessment & Plan Note (Signed)
 Refill albuterol  rescue inhaler to use PRN

## 2024-08-01 NOTE — Assessment & Plan Note (Signed)
 Continue vitamin D  1000 units daily  Update levels next labs.

## 2024-08-01 NOTE — Assessment & Plan Note (Signed)
 Update A1c at next labs

## 2024-08-01 NOTE — Patient Instructions (Addendum)
 As BP was too high, increase losartan  to 100mg  daily - new dose is at pharmacy Return in 1 week after starting new BP medicine dose for labs only.  For right hip bursitis and lat dorsi strain - try exercises provided today. May use heating pad to right lower back and hip.  Ok to try muscle relaxant - robaxin  - with sedation precautions I will sign you up for Cologuard.  We will be due for bone density scan soon - will discuss next visit  Avoid anything with decongestants as it can raise blood pressures.  Return in 6 weeks for blood pressure follow up visit  Return as needed or in 1 year for next wellness visit

## 2024-08-01 NOTE — Assessment & Plan Note (Signed)
 Continue dietary calcium , vit D intake, regular weight bearing exercise as able  Consider rpt DEXA over the next year.

## 2024-08-01 NOTE — Assessment & Plan Note (Signed)
 Anticipate acute bronchitis  She is improving No signs of ongoing bacterial infection Provided with tessalon  and albuterol  inhaler to use PRN

## 2024-08-01 NOTE — Assessment & Plan Note (Signed)
 Chronic, stable only on zetia . Statin intolerance (abd pain, transaminitis) This is followed by cardiology The 10-year ASCVD risk score (Arnett DK, et al., 2019) is: 15.6%   Values used to calculate the score:     Age: 69 years     Clinically relevant sex: Female     Is Non-Hispanic African American: No     Diabetic: No     Tobacco smoker: No     Systolic Blood Pressure: 150 mmHg     Is BP treated: Yes     HDL Cholesterol: 51 mg/dL     Total Cholesterol: 143 mg/dL

## 2024-08-09 ENCOUNTER — Encounter

## 2024-08-10 ENCOUNTER — Other Ambulatory Visit

## 2024-09-12 ENCOUNTER — Ambulatory Visit: Admitting: Family Medicine

## 2025-07-16 ENCOUNTER — Ambulatory Visit
